# Patient Record
Sex: Male | Born: 1985 | Hispanic: No | Marital: Married | Smoking: Never smoker
Health system: Southern US, Community
[De-identification: ages and names within clinical notes are randomized; demographics above are authoritative.]

## PROBLEM LIST (undated history)

## (undated) HISTORY — PX: SPINE SURGERY: SHX786

## (undated) HISTORY — PX: APPENDECTOMY: SHX54

---

## 2014-05-10 DIAGNOSIS — K760 Fatty (change of) liver, not elsewhere classified: Secondary | ICD-10-CM | POA: Insufficient documentation

## 2017-01-04 DIAGNOSIS — D432 Neoplasm of uncertain behavior of brain, unspecified: Secondary | ICD-10-CM | POA: Insufficient documentation

## 2017-01-04 DIAGNOSIS — Z85841 Personal history of malignant neoplasm of brain: Secondary | ICD-10-CM | POA: Insufficient documentation

## 2020-09-22 ENCOUNTER — Other Ambulatory Visit: Payer: Self-pay

## 2020-09-22 ENCOUNTER — Ambulatory Visit
Admission: EM | Admit: 2020-09-22 | Discharge: 2020-09-22 | Disposition: A | Discharge status: Home / Self Care | Payer: PRIVATE HEALTH INSURANCE | Attending: Emergency Medicine | Admitting: Emergency Medicine

## 2020-09-22 DIAGNOSIS — N451 Epididymitis: Secondary | ICD-10-CM | POA: Insufficient documentation

## 2020-09-22 LAB — URINALYSIS, COMPLETE (UACMP) WITH MICROSCOPIC
Bacteria, UA: NONE SEEN
Bilirubin Urine: NEGATIVE
Glucose, UA: NEGATIVE mg/dL
Hgb urine dipstick: NEGATIVE
Ketones, ur: NEGATIVE mg/dL
Leukocytes,Ua: NEGATIVE
Nitrite: NEGATIVE
Protein, ur: NEGATIVE mg/dL
Specific Gravity, Urine: 1.02 (ref 1.005–1.030)
Squamous Epithelial / HPF: NONE SEEN (ref 0–5)
pH: 7 (ref 5.0–8.0)

## 2020-09-22 LAB — PREGNANCY, URINE: Preg Test, Ur: NEGATIVE

## 2020-09-22 MED ORDER — DOXYCYCLINE HYCLATE 100 MG PO CAPS: 100.0000 mg | ORAL_CAPSULE | Freq: Two times a day (BID) | ORAL | 0 refills | Status: AC

## 2020-09-22 MED ORDER — IBUPROFEN 600 MG PO TABS: 600.0000 mg | ORAL_TABLET | Freq: Four times a day (QID) | ORAL | 0 refills | Status: DC | PRN

## 2020-09-22 NOTE — ED Provider Notes (Signed)
[facility]    CSN: [id number] Arrival date & time: 09/22/20  1424      History   Chief Complaint Chief Complaint  Patient presents with  . Groin Pain    HPI Jack Spencer is a 35 y.o. male.   HPI   35 year old male here for evaluation of groin pain.  Patient reports that he has had pain that has been waxing and waning for the last 3 to 4 days.  He mostly has it on the left side of his scrotum but occasionally will be on the right.  Patient has a history of epididymitis in the past with his most recent infection being at Christmas time 2021.  Patient states he is also had some frequency of urination.  Patient denies fever, pain with urination, or penile discharge.  Patient is sexually active but he is in a monogamous relationship with his spouse for last 7 years.  History reviewed. No pertinent past medical history.  There are no problems to display for this patient.   Past Surgical History:  Procedure Laterality Date  . APPENDECTOMY    . SPINE SURGERY         Home Medications    Prior to Admission medications   Medication Sig Start Date End Date Taking? Authorizing Provider  doxycycline (VIBRAMYCIN) 100 MG capsule Take 1 capsule (100 mg total) by mouth 2 (two) times daily for 7 days. 09/22/20 09/29/20 Yes Becky Augusta, NP  escitalopram (LEXAPRO) 10 MG tablet Take 1 tablet by mouth daily. 08/09/17  Yes [provider]  ibuprofen (ADVIL) 600 MG tablet Take 1 tablet (600 mg total) by mouth every 6 (six) hours as needed. 09/22/20  Yes Becky Augusta, NP    Family History Family History  Problem Relation Age of Onset  . Breast cancer Mother   . Colon cancer Father     Social History Social History   Tobacco Use  . Smoking status: Never Smoker  . Smokeless tobacco: Never Used  Vaping Use  . Vaping Use: Never used  Substance Use Topics  . Alcohol use: Not Currently  . Drug use: Not Currently     Allergies   Patient has no known  allergies.   Review of Systems Review of Systems  Constitutional: Negative for activity change, appetite change and fever.  Gastrointestinal: Negative for abdominal pain, nausea and vomiting.  Genitourinary: Positive for frequency and testicular pain. Negative for dysuria, penile discharge, penile pain, penile swelling, scrotal swelling and urgency.  Musculoskeletal: Negative for back pain.  Skin: Negative for rash.     Physical Exam Triage Vital Signs ED Triage Vitals  Enc Vitals Group     BP 09/22/20 1442 (!) 127/95     Pulse Rate 09/22/20 1442 97     Resp 09/22/20 1442 17     Temp 09/22/20 1442 98.7 F (37.1 C)     Temp Source 09/22/20 1442 Oral     SpO2 09/22/20 1442 97 %     Weight 09/22/20 1439 165 lb (74.8 kg)     Height 09/22/20 1439 5\' 5"  (1.651 m)     Head Circumference --      Peak Flow --      Pain Score 09/22/20 1439 2     Pain Loc --      Pain Edu? --      Excl. in GC? --    No data found.  Updated Vital Signs BP (!) 127/95 (BP Location: Left Arm)  Pulse 97   Temp 98.7 F (37.1 C) (Oral)   Resp 17   Ht 5\' 5"  (1.651 m)   Wt 165 lb (74.8 kg)   SpO2 97%   BMI 27.46 kg/m   Visual Acuity Right Eye Distance:   Left Eye Distance:   Bilateral Distance:    Right Eye Near:   Left Eye Near:    Bilateral Near:     Physical Exam Vitals and nursing note reviewed.  Constitutional:      General: He is not in acute distress.    Appearance: Normal appearance. He is not ill-appearing.  HENT:     Head: Normocephalic and atraumatic.  Cardiovascular:     Rate and Rhythm: Normal rate and regular rhythm.     Pulses: Normal pulses.     Heart sounds: Normal heart sounds. No murmur heard. No gallop.   Pulmonary:     Effort: Pulmonary effort is normal.     Breath sounds: Normal breath sounds. No wheezing, rhonchi or rales.  Neurological:     Mental Status: He is alert.      UC Treatments / Results  Labs (all labs ordered are listed, but only abnormal  results are displayed) Labs Reviewed  URINALYSIS, COMPLETE (UACMP) WITH MICROSCOPIC  PREGNANCY, URINE    EKG   Radiology No results found.  Procedures Procedures (including critical care time)  Medications Ordered in UC Medications - No data to display  Initial Impression / Assessment and Plan / UC Course  I have reviewed the triage vital signs and the nursing notes.  Pertinent labs & imaging results that were available during my care of the patient were reviewed by me and considered in my medical decision making (see chart for details).   Patient is a pleasant 35 year old male who is here for evaluation of groin pain.  Patient is very tearful and is concerned that he may have testicular cancer.  Patient reports that his pain has been coming and going for last 3 to 4 days.  His left side is worse than his right side.  Patient has history of epididymitis and has been treated with antibiotics twice in the past.  He says the pain feels very much the same as last 2 times but he is concerned as to why he is getting epididymitis again.  Patient has had some frequency of urination but no urgency or pain with urination, no penile discharge, no fever.  Physical exam reveals an unremarkable penis without rashes, swelling, or discharge from the urethral meatus.  Patient's testicles are tender but he says this is baseline for him.  The testicles are nonswollen, they have a smooth texture, no masses appreciated.  Left epididymis is enlarged.  No bleeding palpated and no bleeding of the spermatic cord palpated.  Right epididymis is normal in size without bleeding.  Patient's exam is consistent with epididymitis.  Will check UA for urinary frequency to ensure he does not have a UTI.  Urinalysis is negative for infection.  We will discharge patient home on doxycycline twice daily for 7 days for treatment of epididymitis, have patient take ibuprofen 600 mg every 6 hours as needed for pain, and wear  supportive underwear.  We will also send referral to urology for recurrent epididymitis at patient's request.   Final Clinical Impressions(s) / UC Diagnoses   Final diagnoses:  Epididymitis, left     Discharge Instructions     Take the doxycycline twice daily for 7 days with food for  treatment of the epididymitis.  The ibuprofen, 600 mg every 6 hours with food, as needed for testicular discomfort.  Wear supportive underwear.  We have referred you to urology for evaluation of recurrent epididymitis.    ED Prescriptions    Medication Sig Dispense Auth. Provider   doxycycline (VIBRAMYCIN) 100 MG capsule Take 1 capsule (100 mg total) by mouth 2 (two) times daily for 7 days. 14 capsule Becky Augusta, NP   ibuprofen (ADVIL) 600 MG tablet Take 1 tablet (600 mg total) by mouth every 6 (six) hours as needed. 30 tablet Becky Augusta, NP     PDMP not reviewed this encounter.   Becky Augusta, NP 09/22/20 1555

## 2020-09-22 NOTE — Discharge Instructions (Addendum)
Take the doxycycline twice daily for 7 days with food for treatment of the epididymitis.  The ibuprofen, 600 mg every 6 hours with food, as needed for testicular discomfort.  Wear supportive underwear.  We have referred you to urology for evaluation of recurrent epididymitis.

## 2020-09-22 NOTE — ED Triage Notes (Signed)
Patient states that he has been having groin pain over the last few days worse on the left side. Patient states that he has had minimal swelling.

## 2020-09-24 DIAGNOSIS — F411 Generalized anxiety disorder: Secondary | ICD-10-CM | POA: Insufficient documentation

## 2020-10-01 ENCOUNTER — Other Ambulatory Visit: Payer: Self-pay

## 2020-10-01 ENCOUNTER — Other Ambulatory Visit
Admission: RE | Admit: 2020-10-01 | Discharge: 2020-10-01 | Disposition: A | Discharge status: Home / Self Care | Payer: 59 | Attending: Urology | Admitting: Urology

## 2020-10-01 ENCOUNTER — Ambulatory Visit (INDEPENDENT_AMBULATORY_CARE_PROVIDER_SITE_OTHER): Payer: 59 | Admitting: Urology

## 2020-10-01 ENCOUNTER — Other Ambulatory Visit: Payer: Self-pay | Admitting: *Deleted

## 2020-10-01 ENCOUNTER — Encounter: Payer: Self-pay | Admitting: Urology

## 2020-10-01 VITALS — BP 131/91 | HR 101 | Ht 65.0 in | Wt 169.0 lb

## 2020-10-01 DIAGNOSIS — N453 Epididymo-orchitis: Secondary | ICD-10-CM

## 2020-10-01 DIAGNOSIS — N5082 Scrotal pain: Secondary | ICD-10-CM | POA: Diagnosis not present

## 2020-10-01 LAB — URINALYSIS, COMPLETE (UACMP) WITH MICROSCOPIC
Bilirubin Urine: NEGATIVE
Glucose, UA: NEGATIVE mg/dL
Hgb urine dipstick: NEGATIVE
Ketones, ur: NEGATIVE mg/dL
Leukocytes,Ua: NEGATIVE
Nitrite: NEGATIVE
Protein, ur: NEGATIVE mg/dL
Specific Gravity, Urine: 1.02 (ref 1.005–1.030)
pH: 7 (ref 5.0–8.0)

## 2020-10-01 NOTE — Progress Notes (Signed)
   10/01/20 12:41 PM   Madelaine Bhat Lequita Halt 12-17-1985 [id number]  CC: recurrent epididymitis/scrotal pain  HPI: I saw Mr. Skeens in urology clinic today for the above issues.  Is a 35 year old male who reports 3 episodes of " epididymitis" over the last 2 to 3 years.  Urinalysis each time was completely benign with no bacteria, leukocytes, WBCs, or microscopic hematuria.  He was treated in urgent cares with antibiotics, and reports that his pain would ultimately improve over the next 5 to 10 days.  No prior positive urine cultures.  He denies any urinary symptoms or gross hematuria.  He is otherwise healthy.  He admits to a fair amount of health anxiety related to his scrotal pain.  He denies any prior urologic surgeries.  He thinks this is mostly been on the left side, but occasionally will have right-sided scrotal pain as well.  He reports his testicles are always sensitive.  He denies any history of STDs, and is married for the last 7 years with only one sexual partner.  Urinalysis today benign.   Family History: Family History  Problem Relation Age of Onset  . Breast cancer Mother   . Colon cancer Father     Social History:  reports that he has never smoked. He has never used smokeless tobacco. He reports previous alcohol use. He reports previous drug use.  Physical Exam: BP (!) 131/91   Pulse (!) 101   Ht 5\' 5"  (1.651 m)   Wt 169 lb (76.7 kg)   BMI 28.12 kg/m    Constitutional:  Alert and oriented, No acute distress. Cardiovascular: No clubbing, cyanosis, or edema. Respiratory: Normal respiratory effort, no increased work of breathing. GI: Abdomen is soft, nontender, nondistended, no abdominal masses GU: Circumcised phallus with patent meatus, no lesions, testicles 37 cc and descended bilaterally without masses, mildly tender bilaterally.  Laboratory Data: Reviewed, see HPI  Pertinent Imaging: None to review  Assessment & Plan:   In summary he is a 35 year old healthy male  with 3 episodes of scrotal pain over the last 2 to 3 years that have resolved with antibiotics, though all urinalyses at the time of presentation have been completely benign.  He is concerned this could potentially represent cancer.  We discussed the differences between chronic scrotal pain and true epididymitis, and it is difficult to determine if he truly has had infections, or he has had flares of idiopathic scrotal pain that have resolved spontaneously.  Certainly, the fact that he improved with antibiotics would suggest this could be related to epididymitis.  I recommended starting cranberry tablets daily to help prevent any infections, and I ordered a scrotal ultrasound to rule out any underlying abnormalities.  We also discussed behavioral strategies of snug fitting underwear, and NSAIDs for flares of scrotal pain  Call with scrotal ultrasound results  31, MD 10/01/2020  [facility] [address] [place], [place] [phone]

## 2020-10-03 ENCOUNTER — Telehealth: Payer: Self-pay | Admitting: *Deleted

## 2020-10-03 NOTE — Telephone Encounter (Signed)
Spoke with patient and advised results   

## 2020-10-03 NOTE — Telephone Encounter (Signed)
.  left message to have patient return my call.  

## 2020-10-03 NOTE — Telephone Encounter (Signed)
Okay to take anti-inflammatories for pain, will call with scrotal ultrasound results  Legrand Rams, MD 10/03/2020

## 2020-10-03 NOTE — Telephone Encounter (Signed)
Patient called today and states he is having more pain in his right testicle not his left now. Patient is not having an fever or chills . Pain level was at 1-2 now   He states he is getting an ultrasound tomorrow.

## 2020-10-04 ENCOUNTER — Other Ambulatory Visit: Payer: Self-pay

## 2020-10-04 ENCOUNTER — Telehealth: Payer: Self-pay

## 2020-10-04 ENCOUNTER — Ambulatory Visit
Admission: RE | Admit: 2020-10-04 | Discharge: 2020-10-04 | Disposition: A | Discharge status: Home / Self Care | Payer: PRIVATE HEALTH INSURANCE | Source: Ambulatory Visit | Attending: Urology | Admitting: Urology

## 2020-10-04 DIAGNOSIS — N5082 Scrotal pain: Secondary | ICD-10-CM | POA: Diagnosis present

## 2020-10-04 NOTE — Telephone Encounter (Signed)
Pt LM on triage line requesting results of ultrasound. Please advise.

## 2020-10-07 ENCOUNTER — Telehealth: Payer: Self-pay

## 2020-10-07 NOTE — Telephone Encounter (Signed)
Called pt informed him of the information below. Pt gave verbal understanding. Lengthy discussion had with pt in regards to normal results. Advised pt to call back if sx worsen or change. Pt voiced understanding.

## 2020-10-07 NOTE — Telephone Encounter (Signed)
-----   Message from Sondra Come, MD sent at 10/07/2020  7:45 AM EDT ----- No worrisome findings on scrotal US  Legrand Rams, MD 10/07/2020

## 2020-10-07 NOTE — Telephone Encounter (Signed)
Pt calls triage line, LM questioning his u/s report which mentions cellulitis and hydrocele. Pt questions of he should be concerned about this. Please advise.

## 2020-10-07 NOTE — Telephone Encounter (Signed)
Called pt, no answer. Left detailed message for pt, informing him of the information below per DPR. Advised pt to call back for questions or concerns.

## 2020-10-07 NOTE — Telephone Encounter (Signed)
He had no evidence at all of cellulitis on our exam the other week, and the small hydroceles are normal, just extra fluid around the testicles. No worrisome findings  .[email or web address]

## 2021-04-23 DIAGNOSIS — D239 Other benign neoplasm of skin, unspecified: Secondary | ICD-10-CM | POA: Insufficient documentation

## 2021-04-23 DIAGNOSIS — J3089 Other allergic rhinitis: Secondary | ICD-10-CM | POA: Insufficient documentation

## 2021-08-07 ENCOUNTER — Ambulatory Visit: Payer: 59 | Admitting: Physician Assistant

## 2021-08-13 ENCOUNTER — Ambulatory Visit: Payer: 59 | Admitting: Urology

## 2021-08-14 ENCOUNTER — Ambulatory Visit: Payer: Self-pay | Admitting: Urology

## 2021-10-01 ENCOUNTER — Encounter: Payer: Self-pay | Admitting: Urology

## 2021-10-01 ENCOUNTER — Other Ambulatory Visit: Payer: Self-pay

## 2021-10-01 ENCOUNTER — Ambulatory Visit (INDEPENDENT_AMBULATORY_CARE_PROVIDER_SITE_OTHER): Payer: Self-pay | Admitting: Urology

## 2021-10-01 VITALS — BP 141/88 | HR 93 | Ht 65.0 in | Wt 170.0 lb

## 2021-10-01 DIAGNOSIS — M6289 Other specified disorders of muscle: Secondary | ICD-10-CM

## 2021-10-01 DIAGNOSIS — N453 Epididymo-orchitis: Secondary | ICD-10-CM

## 2021-10-01 LAB — MICROSCOPIC EXAMINATION
Bacteria, UA: NONE SEEN
Epithelial Cells (non renal): NONE SEEN /hpf (ref 0–10)
RBC, Urine: NONE SEEN /hpf (ref 0–2)
WBC, UA: NONE SEEN /hpf (ref 0–5)

## 2021-10-01 LAB — URINALYSIS, COMPLETE
Bilirubin, UA: NEGATIVE
Glucose, UA: NEGATIVE
Ketones, UA: NEGATIVE
Leukocytes,UA: NEGATIVE
Nitrite, UA: NEGATIVE
Protein,UA: NEGATIVE
RBC, UA: NEGATIVE
Specific Gravity, UA: 1.025 (ref 1.005–1.030)
Urobilinogen, Ur: 0.2 mg/dL (ref 0.2–1.0)
pH, UA: 7 (ref 5.0–7.5)

## 2021-10-01 NOTE — Patient Instructions (Signed)
Pelvic Floor Dysfunction, Male   Pelvic floor dysfunction (PFD) is a condition that results when the group of muscles and connective tissues that support the organs in the pelvis (pelvic floor muscles) do not work well. These muscles and their connections form a sling that supports the colon and bladder. In men, these muscles also support the prostate gland. PFD causes pelvic floor muscles to be too weak, too tight, or both. In PFD, muscle movements are not coordinated. This may cause bowel or bladder problems. It may also cause pain. What are the causes? This condition may be caused by an injury to the pelvic area or by a weakening of pelvic muscles. In many cases, the exact cause is not known. What increases the risk? The following factors may make you more likely to develop PFD: Having chronic bladder tissue inflammation (interstitial cystitis). Being an older person. Being overweight. History of radiation treatment for cancer in the pelvic region. Previous pelvic surgery, such as removal of the prostate gland (prostatectomy). What are the signs or symptoms? Symptoms of this condition vary and may include: Bladder symptoms, such as: Trouble starting urination and emptying the bladder. Frequent urinary tract infections. Leaking urine when coughing, laughing, or exercising (stress incontinence). Having to pass urine urgently or frequently. Pain when passing urine. Bowel symptoms, such as: Constipation. Urgent or frequent bowel movements. Incomplete bowel movements. Painful bowel movements. Leaking stool or gas. Unexplained genital or rectal pain. Genital or rectal muscle spasms. Low back pain. Sexual dysfunction, such as erectile dysfunction, premature ejaculation, or pain during or after sexual activity. How is this diagnosed? This condition is diagnosed based on: Your symptoms and medical history. A physical exam. During the exam, your health care provider may check your pelvic  muscles for tightness, spasm, pain, or weakness. This may include a rectal exam. In some cases, you may have diagnostic tests, such as: Electrical muscle function tests. Urine flow testing. X-ray tests of bowel function. Ultrasound of the pelvic organs. How is this treated? Treatment for this condition depends on your symptoms. Treatment options include: Physical therapy. This may include Kegel exercises to help relax or strengthen the pelvic floor muscles. Biofeedback. This type of therapy provides feedback on how tight your pelvic floor muscles are so that you can learn to control them. Massage therapy. A treatment that involves electrical stimulation of the pelvic floor muscles to help control pain (transcutaneous electrical nerve stimulation, or TENS). Sound wave therapy (ultrasound) to reduce muscle spasms. Medicines, such as: Muscle relaxants. Bladder control medicines. Surgery to reconstruct or support pelvic floor muscles may be an option if other treatments do not help. Follow these instructions at home: Activity Do your usual activities as told by your health care provider. Ask your health care provider if you should modify any activities. Do pelvic floor strengthening or relaxing exercises at home as told by your physical therapist. Lifestyle Maintain a healthy weight. Eat foods that are high in fiber, such as beans, whole grains, and fresh fruits and vegetables. Limit foods that are high in fat and processed sugars, such as fried or sweet foods. Manage stress with relaxation techniques such as yoga or meditation. General instructions If you have problems with leakage: Use absorbable pads or wear padded underwear. Wash your genital and anal area frequently with mild soap. Keep your genital and anal area as clean and dry as possible. Ask your health care provider if you should try a barrier cream to prevent skin irritation. Take warm baths to relieve  pelvic muscle tension or  spasms. Take over-the-counter and prescription medicines only as told by your health care provider. Keep all follow-up visits. How is this prevented? The cause of PFD is not always known, but there are a few things you can do to reduce the risk of developing this condition, including: Staying at a healthy weight. Getting regular exercise. Managing stress. Contact a health care provider if: Your symptoms are not improving with home care. You have signs or symptoms of PFD that get worse. You develop new signs or symptoms. You have signs of a urinary tract infection, such as: Fever. Chills. Increased urinary frequency. A burning feeling when urinating. You have not had a bowel movement in 3 days (constipation). Summary Pelvic floor dysfunction results when the muscles and connective tissues in your pelvic floor do not work well. These muscles and their connections form a sling that supports your colon and bladder. In men, these muscles also support the prostate gland. PFD may be caused by an injury to the pelvic area or by a weakening of pelvic muscles. PFD causes pelvic floor muscles to be too weak, too tight, or a combination of both. Symptoms may vary from person to person. In most cases, PFD can be treated with physical therapies and medicines. Surgery may be an option if other treatments do not help. This information is not intended to replace advice given to you by your health care provider. Make sure you discuss any questions you have with your health care provider. Document Revised: 11/20/2020 Document Reviewed: 11/20/2020 Elsevier Patient Education  [place].  Pelvic Pain, Male Pelvic pain is pain in your lower abdomen, below your belly button and between your hips. The pain may start suddenly (be acute), keep coming back (recur), or last a long time (become chronic). Pelvic pain that lasts longer than six months is considered chronic. There are many possible causes of  pelvic pain. Sometimes, the cause is not known. Pelvic pain may affect your: Prostate gland. Urinary system. Digestive tract. Musculoskeletal system. Strained muscles or ligaments may cause pelvic pain. Follow these instructions at home: Medicines Take over-the-counter and prescription medicines only as told by your health care provider. If you were prescribed an antibiotic medicine, take it as told by your health care provider. Do not stop taking the antibiotic even if you start to feel better. Managing pain, stiffness, and swelling  Take warm water baths (sitz baths). Sitz baths help with relaxing your pelvic floor muscles. For a sitz bath, the water only comes up to your hips and covers your buttocks. A sitz bath may done at home in a bathtub or with a portable sitz bath that fits over the toilet. If directed, apply heat to the affected area before you exercise. Use the heat source that your health care provider recommends, such as a moist heat pack or a heating pad. Place a towel between your skin and the heat source. Leave the heat on for 20-30 minutes. Remove the heat if your skin turns bright red. This is especially important if you are unable to feel pain, heat, or cold. You may have a greater risk of getting burned. General instructions Rest as told by your health care provider. Keep a journal of your pelvic pain. Write down: When the pain started. Where the pain is located. What seems to make the pain better or worse. Any symptoms you have along with the pain. Follow your treatment plan as told by your health care provider. This  may include: Pelvic physical therapy. Yoga, meditation, and exercise. Biofeedback. This process trains you to manage your body's response (physiological response) through breathing techniques and relaxation methods. You will work with a therapist while machines are used to monitor your physical symptoms. Acupuncture. This is a type of treatment that  involves stimulating specific points on your body by inserting thin needles through your skin to treat pain. Keep all follow-up visits as told by your health care provider. This is important. Contact a health care provider if: Medicine does not help your pain. Your pain comes back. You have new symptoms. You have a fever or chills. You are constipated. You have blood in your urine or stool. You feel weak or light-headed. Get help right away if: You have sudden severe pain. Your pain steadily gets worse. You have severe pain along with fever, nausea, vomiting, or excessive sweating. Summary Pelvic pain is pain in your lower abdomen, below your belly button and between your hips. There are many possible causes of pelvic pain. Sometimes, the cause is not known. Take over-the-counter and prescription medicines only as told by your health care provider. If you were prescribed an antibiotic medicine, take it as told by your health care provider. Do not stop taking the antibiotic even if you start to feel better. Contact a health care provider if you have new or worsening symptoms. Get help right away if you have severe pain along with fever, nausea, vomiting, or excessive sweating. Keep all follow-up visits as told by your health care provider. This is important. This information is not intended to replace advice given to you by your health care provider. Make sure you discuss any questions you have with your health care provider. Document Revised: 12/01/2017 Document Reviewed: 12/01/2017 Elsevier Patient Education  [place].

## 2021-10-01 NOTE — Progress Notes (Signed)
? ?  10/01/2021 ?3:26 PM  ? ?Jack Spencer ?November 01, 1985 ?[id number] ? ?Reason for visit: Follow up scrotal pain ? ?HPI: ?36 year old male with significant anxiety and health related anxiety who I am seeing for follow-up of scrotal pain.  I last saw him in March 2022 after he had some reported episodes of " epididymitis," however urinalysis and cultures were benign at those times.  He had a scrotal ultrasound last year that was benign.  He does think his anxiety has increased during the pandemic, and he recently started seeing a therapist again which she feels has been helpful.  He drinks primarily carbonated water and tea during the day. ? ?He has had a few other flares of some groin and left scrotal pain, as well as some occasional urinary frequency and pelvic pressure after voiding.  He feels like these are exacerbated when he has anxiety about other issues. ? ?On exam today, testicles are 20 cc and descended bilaterally without masses, nontender. ? ?His clinical picture and history are more consistent with pelvic floor dysfunction, and we had an extensive conversation today about pelvic floor dysfunction and pelvic pain, and that working on anxiety, avoiding bladder irritants, and pelvic floor physical therapy of the mainstays of treatment.  We also discussed stone fitting underwear, NSAIDs, and sitz bath's. ? ?-Referral placed to pelvic floor physical therapy for pelvic floor dysfunction ?-RTC 6 months virtual visit ? ?I spent 30 total minutes on the day of the encounter including pre-visit review of the medical record, face-to-face time with the patient, and post visit ordering of labs/imaging/tests. ? ? ?Sondra Come, MD ? ?[facility] ?[address] ?[place], [place] [address] ?[phone] ? ?

## 2021-10-30 ENCOUNTER — Ambulatory Visit: Payer: Self-pay | Attending: Urology | Admitting: Physical Therapy

## 2021-10-30 ENCOUNTER — Encounter: Payer: Self-pay | Admitting: Physical Therapy

## 2021-10-30 DIAGNOSIS — M62838 Other muscle spasm: Secondary | ICD-10-CM

## 2021-10-30 DIAGNOSIS — M6289 Other specified disorders of muscle: Secondary | ICD-10-CM | POA: Insufficient documentation

## 2021-10-30 DIAGNOSIS — R102 Pelvic and perineal pain unspecified side: Secondary | ICD-10-CM

## 2021-10-30 DIAGNOSIS — R278 Other lack of coordination: Secondary | ICD-10-CM

## 2021-10-30 NOTE — Therapy (Signed)
?OUTPATIENT PHYSICAL THERAPY MALE PELVIC EVALUATION ? ? ?Patient Name: Jack Spencer ?MRN: [id number] ?DOB:05/28/1986, 36 y.o., male ?Today's Date: 10/31/2021 ? ? PT End of Session - 10/30/21 1030   ? ? Visit Number 1   ? Number of Visits 12   ? Date for PT Re-Evaluation 01/22/22   ? Authorization Type IE 10/30/2021   ? PT Start Time 1030   ? PT Stop Time 1115   ? PT Time Calculation (min) 45 min   ? Activity Tolerance Patient tolerated treatment well   ? Behavior [facility] for tasks assessed/performed   ? ?  ?  ? ?  ? ? ?History reviewed. No pertinent past medical history. ?Past Surgical History:  ?Procedure Laterality Date  ? APPENDECTOMY    ? SPINE SURGERY    ? ?There are no problems to display for this patient. ? ? ?PCP: Cathlean Marseillesaaleman, Timothy P, MD ? ?REFERRING PROVIDER: Sondra ComeSninsky, Brian C, MD ? ?REFERRING DIAG: M62.89 (ICD-10-CM) - Pelvic floor dysfunction  ? ?THERAPY DIAG:  ?Pelvic pain - Plan: PT plan of care cert/re-cert ? ?Other muscle spasm - Plan: PT plan of care cert/re-cert ? ?Other lack of coordination - Plan: PT plan of care cert/re-cert ? ?ONSET DATE: 2022 ? ?SUBJECTIVE ? ?Chief Complaint: Patient states that over the last year he started to notice pain that comes and goes. Patient seems to only notice it when he is dealing with increased anxious feelings. Patient qualifies the pain as annoying more than disruptive. Patient keenly aware of habit of catastrophizing. Patient states that urinary symptoms include post-void UI (a drop or two), urinary urge/frequency. Patient states that pain will happen in twitches and lightning flash of pain. This may happen 4-5x/day for a week or two; patient states that most pain is ~2/10 on average.  ? ?Pertinent History:  ?"36 year old male with significant anxiety and health related anxiety who I am seeing for follow-up of scrotal pain.  I last saw him in March 2022 after he had some reported episodes of " epididymitis," however urinalysis and cultures were benign at  those times.  He had a scrotal ultrasound last year that was benign.  He does think his anxiety has increased during the pandemic, and he recently started seeing a therapist again which she feels has been helpful.  He drinks primarily carbonated water and tea during the day. ?  ?He has had a few other flares of some groin and left scrotal pain, as well as some occasional urinary frequency and pelvic pressure after voiding.  He feels like these are exacerbated when he has anxiety about other issues. ?  ?On exam today, testicles are 20 cc and descended bilaterally without masses, nontender. ?  ?His clinical picture and history are more consistent with pelvic floor dysfunction, and we had an extensive conversation today about pelvic floor dysfunction and pelvic pain, and that working on anxiety, avoiding bladder irritants, and pelvic floor physical therapy of the mainstays of treatment.  We also discussed stone fitting underwear, NSAIDs, and sitz bath's." ? ?Falls Negative. ?Surgical history: Positive for see above. ? ?Work History:  ? ?Occupation: desk job ? ? ?Repeated movements: patient sits with R leg tucked under L in figure 4 for extended periods of time.  ? ?Urological History: ?Unremarkable work-up to date.  ? ?Urinary History: ?Fluid Intake: no still H20, 3 cans of sparkling water, unsweetened ice tea, rare/occasional soda ?Nocturia: 0-1x/night ?Frequency of urination: every 2 hours ?Pain with urination: Negative  ?Difficulty initiating urination:  Negative  ?Intermittent stream: Negative  ?Post-micturition dribble: Positive  ?Frequent UTI: Negative. ? ?Gastrointestinal History: ?Patient notes some constipation or diarrhea in more anxious states or when traveling. Otherwise is regular, 1x/day.  ? ?Location of pain: scrotum, lower abdomen, lower back ?Current pain: 0.5-1/10 ?Max pain: 5-6/10 duration 1 sec, no predictable pattern. Patient will have pain with ejaculation when pain is flared up.  ?Least pain:  0/10 ?Pain quality: pain quality: lightning flash, zap  ?Radiating pain: No ?([place]antes Criteria for pudendal neuralgia: ?Pain is expressed in the anatomical territory of the pudendal nerve. Yes ?Pain is predominantly experienced while sitting because of compression. No The pain does not wake the patient at night. No ?No objective sensory impairment. No ) ? ?Patient Goals: "so the flare ups are milder and I have a way to respond to them" "practicing good habits on a daily basis that don't exacerbate it" ? ? ? ?OBJECTIVE ? ?Mental Status ?Patient is oriented to person, place and time. ?Recent memory is intact. ?Remote memory is intact. ?Attention span and concentration are intact. ?Expressive speech is intact. ?Patient's fund of knowledge is within normal limits for educational level. ? ?POSTURE/OBSERVATIONS:  ?Lumbar lordosis: diminished ?Thoracic kyphosis: mildly increased ?Iliac crest height: appearing equal bilaterally  ?Lumbar lateral shift: not formally assessed   ?Pelvic obliquity: not formally assessed  ?Leg length discrepancy: not formally assessed ? ? ?GAIT: ?Grossly WFL. Patient ambulates with limited arm swing and limited pelvic rotation, but no gross aberrations noted.  ? ?SENSATION: deferred 2/2 to time constraints  ?Grossly intact to light touch bilateral LEs as determined by testing dermatomes L2-S2 Proprioception and hot/cold testing deferred on this date ? ?RANGE OF MOTION: deferred 2/2 to time constraints  ?  LEFT RIGHT  ?Lumbar forward flexion (65):      ?Lumbar extension (30):     ?Lumbar lateral flexion (25):     ?Thoracic and Lumbar rotation (30 degrees):       ?Hip Flexion (0-125):      ?Hip IR (0-45):     ?Hip ER (0-45):     ?Hip Abduction (0-40):     ?Hip extension (0-15):     ? ? ?STRENGTH: MMT deferred 2/2 to time constraints  ? RLE LLE  ?Hip Flexion    ?Hip Extension    ?Hip Abduction     ?Hip Adduction     ?Hip ER     ?Hip IR     ?Knee Extension    ?Knee Flexion    ?Dorsiflexion      ?Plantarflexion (seated)    ? ?ABDOMINAL: deferred 2/2 to time constraints  ?Palpation: ?Suprapubic percussion:  ?Diastasis: ?Scar mobility:  ?Rib flare: ? ?SPECIAL TESTS:deferred 2/2 to time constraints  ? ? ?PHYSICAL PERFORMANCE MEASURES:  ?STS: WNL ?Deep Squat: ?RLE STS: ?LLE STS:  ? :  ?5TSTS: ? ?EXTERNAL PELVIC EXAM: deferred 2/2 to time constraints  ?Palpation: ?Breath coordination: ?Perineal Mobility Testing: ?Voluntary Contraction: present/absent ?Relaxation: present/delayed/ non-relaxing ?Perineal movement with sustained increase in IAP (?bear down?):  ?Perineal movement with rapid increase in IAP (?cough?): ? ?DIGITAL RECTAL EXAM: deferred 2/2 to time constraints  ?Strength (PERF): ?Symmetry: ?Palpation: ?Prolapse: ? ?EDUCATION ?Patient educated on prognosis, POC, and provided with HEP including: not initiated. Patient articulated understanding and returned demonstration. Patient will benefit from further education in order to maximize compliance and understanding for long-term therapeutic gains. ? ?OUTCOME MEASURES: NIH CPSI Pain 6, Urinary 6, QoL 4; FOTO PFDI Pain 4, Urinary 8 ? ?ASSESSMENT ?Patient is a 36  year old presenting to clinic with chief complaints of pelvic pain. Today's evaluation is suggestive of deficits in PFM coordination, PFM non-neurologic tone, and posture as evidenced by shortened anterior chain with increased thoracic kyphosis and diminished lumbar lordosis, 6/10 worst pain, post-micturition dribble, occurrence of weak urinary stream. Patient's responses on NIH-CPSI outcome measures (see above) indicate moderate functional limitations/disability/distress. Patient's progress may be limited due to comorbidities and the relationship between stress and pelvic pain; however, patient's motivation is advantageous. Patient will benefit from continued skilled therapeutic intervention to address deficits in PFM coordination, PFM non-neurologic tone, and posture in order to increase  function and improve overall QOL. ? ?OBJECTIVE IMPAIRMENTS decreased activity tolerance, decreased coordination, increased muscle spasms, impaired flexibility, improper body mechanics, postural dysfunction, and pain.  ? ?

## 2022-04-07 ENCOUNTER — Ambulatory Visit (INDEPENDENT_AMBULATORY_CARE_PROVIDER_SITE_OTHER): Payer: Self-pay | Admitting: Urology

## 2022-04-07 DIAGNOSIS — M6289 Other specified disorders of muscle: Secondary | ICD-10-CM

## 2022-04-07 NOTE — Progress Notes (Signed)
Virtual Visit via Telephone Note  I connected with Jack Spencer on 04/07/22 at 11:30 AM EDT by telephone and verified that I am speaking with the correct person using two identifiers.   Patient location: Home Provider location: [facility]   I discussed the limitations, risks, security and privacy concerns of performing an evaluation and management service by telephone and the availability of in person appointments. We discussed the impact of the COVID-19 pandemic on the healthcare system, and the importance of social distancing and reducing patient and provider exposure. I also discussed with the patient that there may be a patient responsible charge related to this service. The patient expressed understanding and agreed to proceed.  Reason for visit: Scrotal pain  History of Present Illness: 36 year old male who I last saw in March 2023 for scrotal pain/" epididymitis" with negative work-up.  Ultimately was felt this was most likely secondary to health-related anxiety and stress.  He also saw pelvic floor physical therapy.  He denies any problems over the last few months and has been doing well.  He really denies any urinary complaints.  Reassurance was provided and return precautions discussed.   Follow Up: Follow with urology as needed   I discussed the assessment and treatment plan with the patient. The patient was provided an opportunity to ask questions and all were answered. The patient agreed with the plan and demonstrated an understanding of the instructions.   The patient was advised to call back or seek an in-person evaluation if the symptoms worsen or if the condition fails to improve as anticipated.  I provided 6 minutes of non-face-to-face time during this encounter.   Sondra Come, MD

## 2022-04-20 DIAGNOSIS — G4739 Other sleep apnea: Secondary | ICD-10-CM | POA: Insufficient documentation

## 2022-04-20 DIAGNOSIS — K582 Mixed irritable bowel syndrome: Secondary | ICD-10-CM | POA: Insufficient documentation

## 2022-06-07 ENCOUNTER — Emergency Department: Payer: PRIVATE HEALTH INSURANCE

## 2022-06-07 ENCOUNTER — Emergency Department: Payer: Managed Care, Other (non HMO)

## 2022-06-07 ENCOUNTER — Emergency Department
Admission: EM | Admit: 2022-06-07 | Discharge: 2022-06-07 | Disposition: A | Discharge status: Home / Self Care | Payer: Managed Care, Other (non HMO) | Attending: Emergency Medicine | Admitting: Emergency Medicine

## 2022-06-07 ENCOUNTER — Emergency Department
Admission: EM | Admit: 2022-06-07 | Discharge: 2022-06-07 | Disposition: A | Discharge status: Home / Self Care | Payer: PRIVATE HEALTH INSURANCE | Attending: Emergency Medicine | Admitting: Emergency Medicine

## 2022-06-07 ENCOUNTER — Other Ambulatory Visit: Payer: Self-pay

## 2022-06-07 DIAGNOSIS — D72829 Elevated white blood cell count, unspecified: Secondary | ICD-10-CM | POA: Diagnosis not present

## 2022-06-07 DIAGNOSIS — R109 Unspecified abdominal pain: Secondary | ICD-10-CM | POA: Diagnosis present

## 2022-06-07 DIAGNOSIS — R110 Nausea: Secondary | ICD-10-CM | POA: Diagnosis not present

## 2022-06-07 DIAGNOSIS — R7401 Elevation of levels of liver transaminase levels: Secondary | ICD-10-CM | POA: Diagnosis not present

## 2022-06-07 LAB — URINALYSIS, ROUTINE W REFLEX MICROSCOPIC
Bacteria, UA: NONE SEEN
Bacteria, UA: NONE SEEN
Bilirubin Urine: NEGATIVE
Bilirubin Urine: NEGATIVE
Glucose, UA: NEGATIVE mg/dL
Glucose, UA: NEGATIVE mg/dL
Hgb urine dipstick: NEGATIVE
Hgb urine dipstick: NEGATIVE
Ketones, ur: 5 mg/dL — AB
Ketones, ur: 5 mg/dL — AB
Leukocytes,Ua: NEGATIVE
Leukocytes,Ua: NEGATIVE
Nitrite: NEGATIVE
Nitrite: NEGATIVE
Protein, ur: 30 mg/dL — AB
Protein, ur: 30 mg/dL — AB
Specific Gravity, Urine: 1.024 (ref 1.005–1.030)
Specific Gravity, Urine: 1.02 (ref 1.005–1.030)
Squamous Epithelial / HPF: NONE SEEN (ref 0–5)
pH: 7 (ref 5.0–8.0)
pH: 5 (ref 5.0–8.0)

## 2022-06-07 LAB — HEPATIC FUNCTION PANEL
ALT: 54 U/L — ABNORMAL HIGH (ref 0–44)
AST: 27 U/L (ref 15–41)
Albumin: 4.5 g/dL (ref 3.5–5.0)
Alkaline Phosphatase: 102 U/L (ref 38–126)
Bilirubin, Direct: 0.3 mg/dL — ABNORMAL HIGH (ref 0.0–0.2)
Indirect Bilirubin: 0.9 mg/dL (ref 0.3–0.9)
Total Bilirubin: 1.2 mg/dL (ref 0.3–1.2)
Total Protein: 8.7 g/dL — ABNORMAL HIGH (ref 6.5–8.1)

## 2022-06-07 LAB — CBC
HCT: 47.9 % (ref 39.0–52.0)
HCT: 47 % (ref 39.0–52.0)
Hemoglobin: 16.3 g/dL (ref 13.0–17.0)
Hemoglobin: 16 g/dL (ref 13.0–17.0)
MCH: 29.2 pg (ref 26.0–34.0)
MCH: 29.3 pg (ref 26.0–34.0)
MCHC: 34 g/dL (ref 30.0–36.0)
MCHC: 34 g/dL (ref 30.0–36.0)
MCV: 85.7 fL (ref 80.0–100.0)
MCV: 86.1 fL (ref 80.0–100.0)
Platelets: 293 10*3/uL (ref 150–400)
Platelets: 287 K/uL (ref 150–400)
RBC: 5.59 MIL/uL (ref 4.22–5.81)
RBC: 5.46 MIL/uL (ref 4.22–5.81)
RDW: 11.9 % (ref 11.5–15.5)
RDW: 11.9 % (ref 11.5–15.5)
WBC: 14.6 10*3/uL — ABNORMAL HIGH (ref 4.0–10.5)
WBC: 13.9 K/uL — ABNORMAL HIGH (ref 4.0–10.5)
nRBC: 0 % (ref 0.0–0.2)
nRBC: 0 % (ref 0.0–0.2)

## 2022-06-07 LAB — BASIC METABOLIC PANEL WITH GFR
Anion gap: 9 (ref 5–15)
BUN: 11 mg/dL (ref 6–20)
CO2: 24 mmol/L (ref 22–32)
Calcium: 9.5 mg/dL (ref 8.9–10.3)
Chloride: 106 mmol/L (ref 98–111)
Creatinine, Ser: 0.9 mg/dL (ref 0.61–1.24)
GFR, Estimated: >60 mL/min (ref >60)
Glucose, Bld: 123 mg/dL — ABNORMAL HIGH (ref 70–99)
Potassium: 4 mmol/L (ref 3.5–5.1)
Sodium: 139 mmol/L (ref 135–145)

## 2022-06-07 LAB — BASIC METABOLIC PANEL
Anion gap: 11 (ref 5–15)
BUN: 12 mg/dL (ref 6–20)
CO2: 23 mmol/L (ref 22–32)
Calcium: 9.3 mg/dL (ref 8.9–10.3)
Chloride: 104 mmol/L (ref 98–111)
Creatinine, Ser: 0.85 mg/dL (ref 0.61–1.24)
GFR, Estimated: >60 mL/min (ref >60)
Glucose, Bld: 113 mg/dL — ABNORMAL HIGH (ref 70–99)
Potassium: 3.8 mmol/L (ref 3.5–5.1)
Sodium: 138 mmol/L (ref 135–145)

## 2022-06-07 LAB — LIPASE, BLOOD: Lipase: 45 U/L (ref 11–51)

## 2022-06-07 MED ORDER — OXYCODONE-ACETAMINOPHEN 5-325 MG PO TABS
1.0000 | ORAL_TABLET | Freq: Once | ORAL | Status: AC
  Administered 2022-06-07: 1 via ORAL
  Filled 2022-06-07: qty 1

## 2022-06-07 MED ORDER — LIDOCAINE 5 % EX PTCH: 1.0000 | MEDICATED_PATCH | Freq: Two times a day (BID) | CUTANEOUS | 0 refills | Status: DC

## 2022-06-07 MED ORDER — ONDANSETRON 4 MG PO TBDP: 4.0000 mg | ORAL_TABLET | Freq: Three times a day (TID) | ORAL | 0 refills | Status: DC | PRN

## 2022-06-07 MED ORDER — KETOROLAC TROMETHAMINE 30 MG/ML IJ SOLN
30.0000 mg | Freq: Once | INTRAMUSCULAR | Status: AC
  Administered 2022-06-07: 30 mg via INTRAVENOUS
  Filled 2022-06-07: qty 1

## 2022-06-07 MED ORDER — LIDOCAINE 5 % EX PTCH
1.0000 | MEDICATED_PATCH | CUTANEOUS | Status: DC
  Administered 2022-06-07: 1 via TRANSDERMAL
  Filled 2022-06-07: qty 1

## 2022-06-07 MED ORDER — ONDANSETRON HCL 4 MG/2ML IJ SOLN
4.0000 mg | Freq: Once | INTRAMUSCULAR | Status: AC
  Administered 2022-06-07: 4 mg via INTRAVENOUS
  Filled 2022-06-07: qty 2

## 2022-06-07 MED ORDER — KETOROLAC TROMETHAMINE 10 MG PO TABS
ORAL_TABLET | ORAL | 0 refills | Status: DC
Start: 2022-06-07 — End: 2022-12-08

## 2022-06-07 MED ORDER — CYCLOBENZAPRINE HCL 10 MG PO TABS: 10.0000 mg | ORAL_TABLET | Freq: Three times a day (TID) | ORAL | 0 refills | Status: AC | PRN

## 2022-06-07 MED ORDER — CYCLOBENZAPRINE HCL 10 MG PO TABS
10.0000 mg | ORAL_TABLET | Freq: Once | ORAL | Status: AC
  Administered 2022-06-07: 10 mg via ORAL
  Filled 2022-06-07: qty 1

## 2022-06-07 MED ORDER — OXYCODONE-ACETAMINOPHEN 5-325 MG PO TABS: 1.0000 | ORAL_TABLET | Freq: Three times a day (TID) | ORAL | 0 refills | Status: AC | PRN

## 2022-06-07 NOTE — ED Triage Notes (Signed)
Pt sts that he was seen today and d/c. Pt sts that the toradol worked that he received in the ED but the pain is very bad. Pt is very tearful in triage.

## 2022-06-07 NOTE — ED Provider Notes (Signed)
[facility] Provider Note    Event Date/Time   First MD Initiated Contact with Patient 06/07/22 0900     (approximate)   History   No chief complaint on file.   HPI  Jack Spencer is a 36 y.o. male presents to the ED with complaint of right flank pain that began during the night.  Patient states that it radiates around to the front and he has had nausea but denies any vomiting.  No history of injury to the flank area.  Patient believes that he had a kidney stone but was not actually seen for it.  Positive family history of stones.  Patient has a history of neurolysed anxiety, IBS with constipation and diarrhea, sleep apnea, hepatic steatosis and past surgical history appendectomy.     Physical Exam   Triage Vital Signs: ED Triage Vitals [06/07/22 0835]  Enc Vitals Group     BP (!) 144/102     Pulse Rate (!) 101     Resp 20     Temp 98.5 F (36.9 C)     Temp Source Oral     SpO2 95 %     Weight 170 lb (77.1 kg)     Height 5\' 5"  (1.651 m)     Head Circumference      Peak Flow      Pain Score 9     Pain Loc      Pain Edu?      Excl. in GC?     Most recent vital signs: Vitals:   06/07/22 0835 06/07/22 1052  BP: (!) 144/102 (!) 126/92  Pulse: (!) 101 84  Resp: 20 16  Temp: 98.5 F (36.9 C)   SpO2: 95% 93%     General: Awake, no distress.  Tearful CV:  Good peripheral perfusion.  Heart regular rate and rhythm. Resp:  Normal effort.  Lungs are clear bilaterally. Abd:  No distention.  Soft, nontender, bowel sounds are present x4 quadrants. Other:  Right flank area tender to light palpation.  Range of motion is guarded secondary to discomfort.  Muscle skeletal tenderness on palpation.  No point tenderness on palpation of the lumbar spine.  Patient guards with movement and has an antalgic gait.   ED Results / Procedures / Treatments   Labs (all labs ordered are listed, but only abnormal results are displayed) Labs Reviewed  URINALYSIS,  ROUTINE W REFLEX MICROSCOPIC - Abnormal; Notable for the following components:      Result Value   Color, Urine YELLOW (*)    APPearance CLEAR (*)    Ketones, ur 5 (*)    Protein, ur 30 (*)    All other components within normal limits  BASIC METABOLIC PANEL - Abnormal; Notable for the following components:   Glucose, Bld 123 (*)    All other components within normal limits  CBC - Abnormal; Notable for the following components:   WBC 14.6 (*)    All other components within normal limits      RADIOLOGY  CT scan renal study per radiologist is negative for urolithiasis or hydronephrosis. Mild hepatic steatosis.   PROCEDURES:  Critical Care performed:   Procedures   MEDICATIONS ORDERED IN ED: Medications  lidocaine (LIDODERM) 5 % 1 patch (1 patch Transdermal Patch Applied 06/07/22 1050)  ketorolac (TORADOL) 30 MG/ML injection 30 mg (30 mg Intravenous Given 06/07/22 0951)  ondansetron (ZOFRAN) injection 4 mg (4 mg Intravenous Given 06/07/22 0951)     IMPRESSION /  MDM / ASSESSMENT AND PLAN / ED COURSE  I reviewed the triage vital signs and the nursing notes.   Differential diagnosis includes, but is not limited to, right flank pain, muscle skeletal pain, muscle strain, urinary tract infection.  36 year old male presents to the ED with complaint of right flank pain that occurred during the night with nausea but no vomiting.  Patient believes that he has in the past had a kidney stone but did not seek medical attention at that time.  Patient urinalysis was negative for RBCs and bacteria.  Ketones were 5 and protein 30 otherwise urinalysis was clear.  BMP nonfasting showed a glucose of 123 and WBC 14.6.  Patient was extremely tearful during his visit initially.  He was given Toradol 30 mg IV and Zofran 4 mg IV and approximately 35 minutes he had relief of his pain from a 10 down to "80% less pain".  A Lidoderm patch was applied.  I spoke to him about the possibility of this being  muscle skeletal pain rather than a kidney stone.  Patient got emotional stating he has anxiety about his health.  Father was present and agrees with the plan and patient was noted to walk to the restroom unassisted without any difficulty and increased gait.  His vital signs improved and patient was made aware that this was a good sign that his pain was under better control then he realized.  A prescription for Toradol, Lidoderm and Zofran was sent to the pharmacy.  Increase fluids.  He is to follow-up with his PCP if any continued problems or return to the emergency department if any severe worsening of his symptoms.      Patient's presentation is most consistent with acute complicated illness / injury requiring diagnostic workup.  FINAL CLINICAL IMPRESSION(S) / ED DIAGNOSES   Final diagnoses:  Acute right flank pain     Rx / DC Orders   ED Discharge Orders          Ordered    ondansetron (ZOFRAN-ODT) 4 MG disintegrating tablet  Every 8 hours PRN        06/07/22 1125    lidocaine (LIDODERM) 5 %  Every 12 hours        06/07/22 1125    ketorolac (TORADOL) 10 MG tablet        06/07/22 1125             Note:  This document was prepared using Dragon voice recognition software and may include unintentional dictation errors.   Johnn Hai, PA-C 06/07/22 1448    Blake Divine, MD 06/07/22 [phone]

## 2022-06-07 NOTE — Discharge Instructions (Signed)
Follow with your primary care provider if any continued problems or concerns.  Continue with the Toradol 10 mg every 6 hours with food, Lidoderm patch every 12 hours and Zofran 4 mg as needed for nausea or vomiting.  You may also use ice or heat to your back as needed for discomfort.  Increase fluids.  You may take Tylenol in addition to these medications if additional pain medication is needed.

## 2022-06-07 NOTE — ED Triage Notes (Signed)
Pt complaining of pain to upper right back states it started yesterday. Pain is starting to radiate to front. Pt feeling nauseated denies emesis. Pt states pain is causing some SOB. No known injury to the area. Pt states "I think its a Kidney Stone."

## 2022-06-07 NOTE — Discharge Instructions (Addendum)
-  We are unsure exactly what the source of your pain is, however given the scans in the laboratory tests that we have performed, it is very unlikely to be from any serious or life-threatening cause.  -You may take the Percocet and the cyclobenzaprine as needed for discomfort, however be careful as it may make you dizzy/drowsy.  Do not operate any machinery while you are taking these medications.  -Please follow-up with your primary care provider within the next few days for reevaluation.  -Return to the emergency department anytime if you begin to experience any new or worsening symptoms.

## 2022-06-07 NOTE — ED Notes (Signed)
Pt. To rad. 

## 2022-06-07 NOTE — ED Provider Notes (Signed)
[facility] Provider Note    Event Date/Time   First MD Initiated Contact with Patient 06/07/22 2037     (approximate)   History   Chief Complaint Flank Pain   HPI Jack Spencer is a 36 y.o. male, history of fatty liver, presents to the emergency department for evaluation of right-sided flank pain.  He states that he was here earlier for the same symptoms, for which he underwent a CT scan and lab work.  His work-up was overall negative, but he did respond well to the ketorolac and states that he was pain-free.  However, he states that his symptoms recur "in waves" periodically throughout the day.  He states that when it does occur, it is 10/10 pain.  He does mention abnormally elevated liver enzymes a month ago and is concerned to gallstones.  Denies fever/chills, chest pain, shortness of breath, nausea/vomiting, dysuria, weakness, rash/lesions, dizziness/lightheadedness, or headache.  History Limitations: No limitations.        Physical Exam  Triage Vital Signs: ED Triage Vitals  Enc Vitals Group     BP 06/07/22 2010 (!) 149/107     Pulse Rate 06/07/22 2010 100     Resp 06/07/22 2010 (!) 21     Temp 06/07/22 2010 98.9 F (37.2 C)     Temp Source 06/07/22 2010 Oral     SpO2 06/07/22 2010 98 %     Weight 06/07/22 2011 165 lb (74.8 kg)     Height --      Head Circumference --      Peak Flow --      Pain Score 06/07/22 2011 9     Pain Loc --      Pain Edu? --      Excl. in GC? --     Most recent vital signs: Vitals:   06/07/22 2010  BP: (!) 149/107  Pulse: 100  Resp: (!) 21  Temp: 98.9 F (37.2 C)  SpO2: 98%    General: Awake, appears very distraught and tearful. Skin: Warm, dry. No rashes or lesions.  Eyes: PERRL. Conjunctivae normal.  CV: Good peripheral perfusion.  Resp: Normal effort.  Abd: Soft, non-tender. No distention.  Neuro: At baseline. No gross neurological deficits.  Musculoskeletal: Normal ROM of all  extremities.  Focused Exam: Negative CVAT bilaterally.  No abdominal tenderness.  No distention.  Patient states he is currently asymptomatic, but he continues to be very tearful.  Physical Exam    ED Results / Procedures / Treatments  Labs (all labs ordered are listed, but only abnormal results are displayed) Labs Reviewed  URINALYSIS, ROUTINE W REFLEX MICROSCOPIC - Abnormal; Notable for the following components:      Result Value   Color, Urine YELLOW (*)    APPearance CLEAR (*)    Ketones, ur 5 (*)    Protein, ur 30 (*)    All other components within normal limits  BASIC METABOLIC PANEL - Abnormal; Notable for the following components:   Glucose, Bld 113 (*)    All other components within normal limits  CBC - Abnormal; Notable for the following components:   WBC 13.9 (*)    All other components within normal limits  HEPATIC FUNCTION PANEL - Abnormal; Notable for the following components:   Total Protein 8.7 (*)    ALT 54 (*)    Bilirubin, Direct 0.3 (*)    All other components within normal limits  LIPASE, BLOOD     EKG N/A.  RADIOLOGY  ED Provider Interpretation: I personally reviewed and interpreted this ultrasound, no evidence of gallstones.  US Abdomen Limited RUQ (LIVER/GB)  Result Date: 06/07/2022 CLINICAL DATA:  Right upper quadrant pain EXAM: ULTRASOUND ABDOMEN LIMITED RIGHT UPPER QUADRANT COMPARISON:  CT from the same day FINDINGS: Gallbladder: No gallstones or wall thickening visualized. No sonographic Murphy sign noted by sonographer. Common bile duct: Diameter: 3.2 mm, unremarkable. Liver: No focal lesion identified. Echogenic parenchyma. Portal vein is patent on color Doppler imaging with normal direction of blood flow towards the liver. Other: None. IMPRESSION: 1. Negative. No gallstones. 2. Echogenic hepatic parenchyma suggesting steatosis. Electronically Signed   By: Corlis Leak M.D.   On: 06/07/2022 21:44   CT Renal Stone Study  Result Date:  06/07/2022 CLINICAL DATA:  Right flank pain EXAM: CT ABDOMEN AND PELVIS WITHOUT CONTRAST TECHNIQUE: Multidetector CT imaging of the abdomen and pelvis was performed following the standard protocol without IV contrast. RADIATION DOSE REDUCTION: This exam was performed according to the departmental dose-optimization program which includes automated exposure control, adjustment of the mA and/or kV according to patient size and/or use of iterative reconstruction technique. COMPARISON:  None Available. FINDINGS: Lower chest: No acute abnormality. Hepatobiliary: Mild hepatic steatosis. No focal liver abnormality is seen. No gallstones, gallbladder wall thickening, or biliary dilatation. Pancreas: Unremarkable. No pancreatic ductal dilatation or surrounding inflammatory changes. Spleen: Normal in size without focal abnormality. Adrenals/Urinary Tract: Adrenal glands are unremarkable. Kidneys are normal, without renal calculi, focal lesion, or hydronephrosis. Bladder is unremarkable. Stomach/Bowel: Stomach is within normal limits. No evidence of bowel wall thickening, distention, or inflammatory changes. Vascular/Lymphatic: Minimal calcified atherosclerosis of the right common iliac artery. No significant vascular findings are present. No enlarged abdominal or pelvic lymph nodes. Reproductive: Nonspecific mineralization within the right prostate gland, likely physiologic. Punctate hyperdensity in the right pelvis (series 2, image 78) is favored to represent a phlebolith. Other: No abdominal wall hernia or abnormality. No abdominopelvic ascites. Musculoskeletal: No acute or significant osseous findings. IMPRESSION: No etiology for right flank pain identified. Specifically, no nephrolithiasis or hydronephrosis. Electronically Signed   By: Jacob Moores M.D.   On: 06/07/2022 09:34    PROCEDURES:  Critical Care performed: N/A.  Procedures    MEDICATIONS ORDERED IN ED: Medications  oxyCODONE-acetaminophen  (PERCOCET/ROXICET) 5-325 MG per tablet 1 tablet (has no administration in time range)  cyclobenzaprine (FLEXERIL) tablet 10 mg (has no administration in time range)  oxyCODONE-acetaminophen (PERCOCET/ROXICET) 5-325 MG per tablet 1 tablet (1 tablet Oral Given 06/07/22 2101)     IMPRESSION / MDM / ASSESSMENT AND PLAN / ED COURSE  I reviewed the triage vital signs and the nursing notes.                              Differential diagnosis includes, but is not limited to, cystitis, pyelonephritis, cholelithiasis, choledocholithiasis, cholecystitis, pancreatitis  ED Course Patient appears well, but very anxious.  Not endorsing any pain at this time.  CBC shows mild leukocytosis at 13.9, decreased from 14.6 earlier today.  No anemia.  BMP shows no electrolyte abnormalities or AKI.  Urinalysis shows no evidence of urinary tract infection or hematuria.  Hepatic function panel shows slightly elevated ALT, but otherwise no significant transaminitis or hyperbilirubinemia.  Assessment/Plan Patient presents with right-sided flank pain x1 day.  He is extremely distraught and tearful.  His physical exam does not reveal any true findings.  His previous CT renal stone study does  not show any acute findings either.  His lab work-up does show a slight elevation in WBC, however I attribute this likely due to pain/anxiety.  His lab work-up is overall unremarkable.  Urinalysis shows no evidence of infection.  There is no evidence to suggest gallstones or cholecystitis on his ultrasound.  No transaminitis on his hepatic function panel.  I spoke to him about these findings.  He expressed frustration and continued to be very tearful about not knowing what is going on.  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  I suspect that this is likely the source of most of his pain/discomfort.  Advised him that there could be something driving his pain, however is unlikely  to be serious or life-threatening given the lack of radiographic or laboratory findings.  We will provide him with a brief course of Percocet and cyclobenzaprine to help manage his discomfort, however I did request that he be reevaluated by his regular doctor within the next few days.  He was agreeable to this.  Will discharge.  Considered admission for this patient, but given his stable presentation and unremarkable findings, he is unlikely to benefit from admission.  Provided the patient with anticipatory guidance, return precautions, and educational material. Encouraged the patient to return to the emergency department at any time if they begin to experience any new or worsening symptoms. Patient expressed understanding and agreed with the plan.   Patient's presentation is most consistent with acute complicated illness / injury requiring diagnostic workup.       FINAL CLINICAL IMPRESSION(S) / ED DIAGNOSES   Final diagnoses:  Flank pain     Rx / DC Orders   ED Discharge Orders          Ordered    oxyCODONE-acetaminophen (PERCOCET) 5-325 MG tablet  Every 8 hours PRN        06/07/22 2210    cyclobenzaprine (FLEXERIL) 10 MG tablet  3 times daily PRN        06/07/22 2210             Note:  This document was prepared using Dragon voice recognition software and may include unintentional dictation errors.   Varney Daily, [place] 06/07/22 2213    Shaune Pollack, MD 06/07/22 2241

## 2022-07-04 DIAGNOSIS — J302 Other seasonal allergic rhinitis: Secondary | ICD-10-CM | POA: Insufficient documentation

## 2022-07-18 IMAGING — US US SCROTUM W/ DOPPLER COMPLETE
1 series · 13 of 25 positions shown · non-contrast
Comparison: None.

CLINICAL DATA: Left-sided scrotal pain

EXAM:
SCROTAL ULTRASOUND
DOPPLER ULTRASOUND OF THE TESTICLES
TECHNIQUE: Complete ultrasound examination of the testicles, epididymis, and
other scrotal structures was performed. Color and spectral Doppler
ultrasound were also utilized to evaluate blood flow to the
testicles.

[Series 1: us scrotum w/ doppler complete · 0.07mm/px · 13 of 49 slices shown]
[im 1/49]
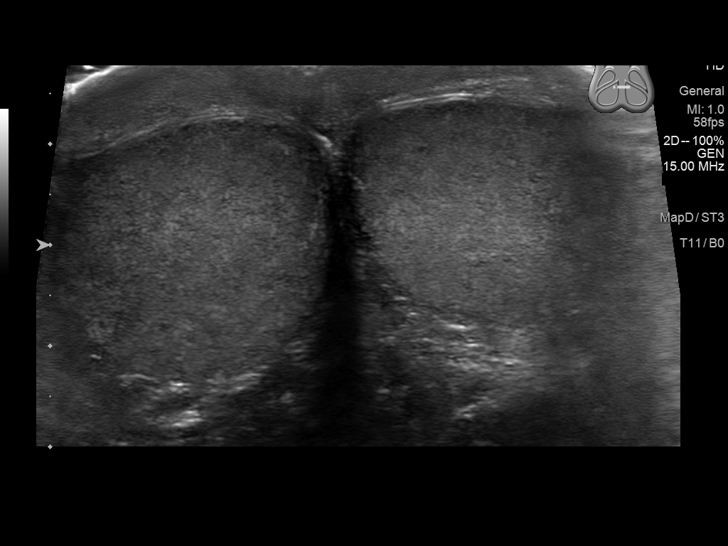
[im 5/49]
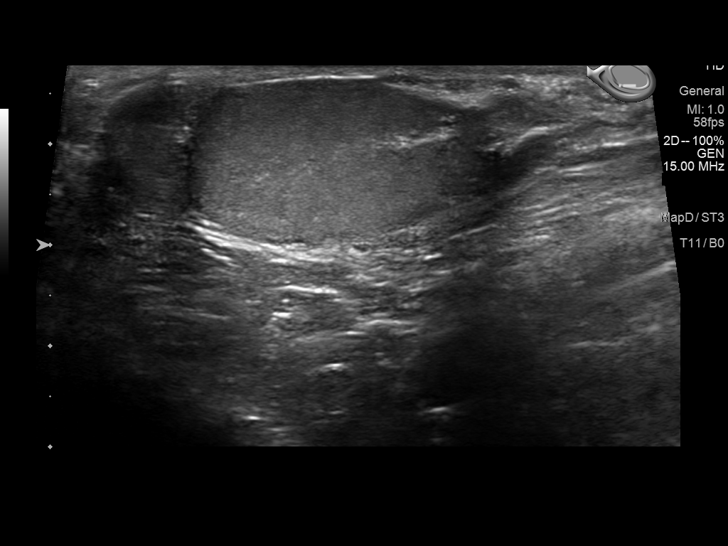
[im 9/49]
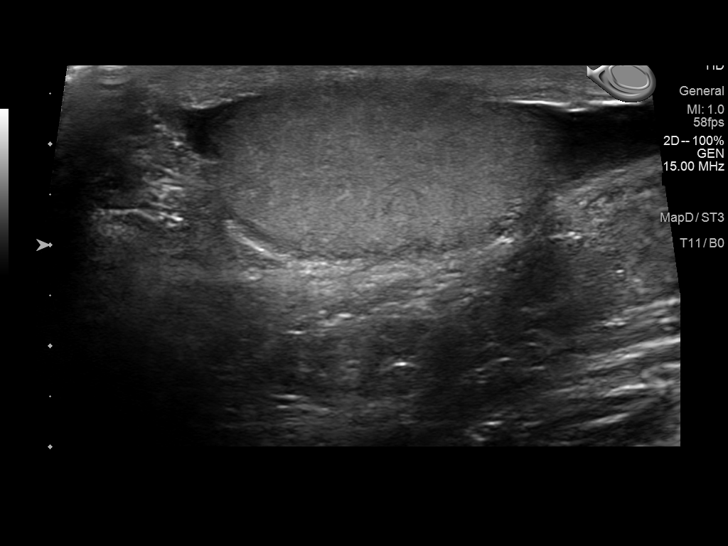
[im 13/49]
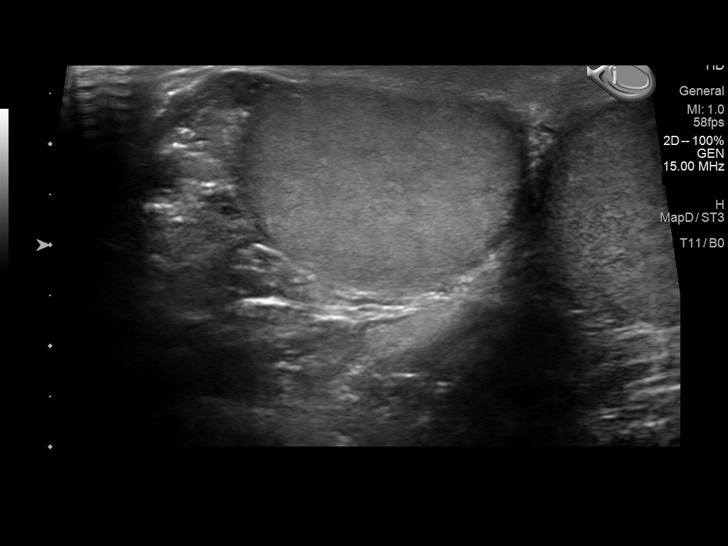
[im 17/49]
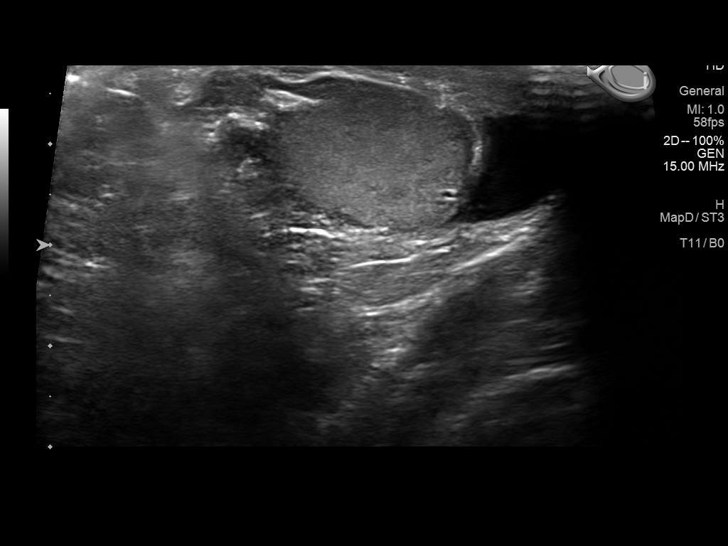
[im 21/49]
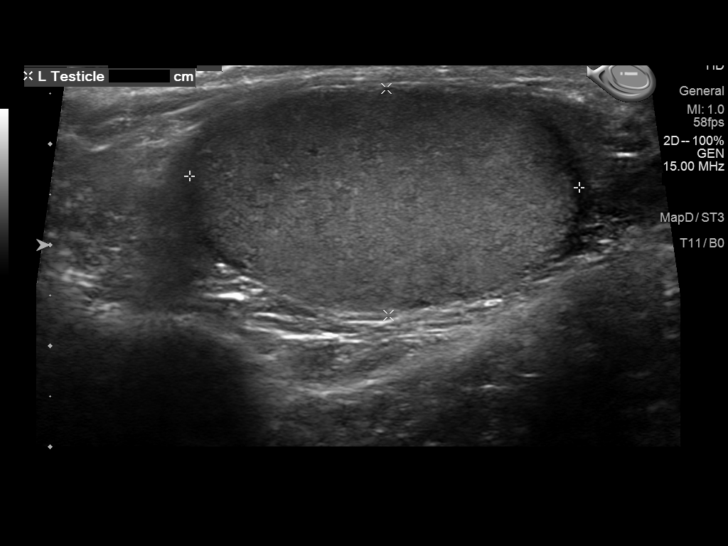
[im 25/49]
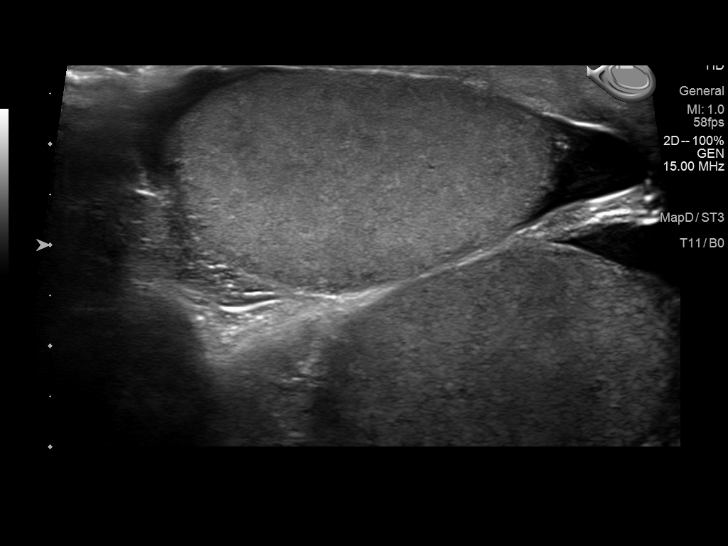
[im 29/49]
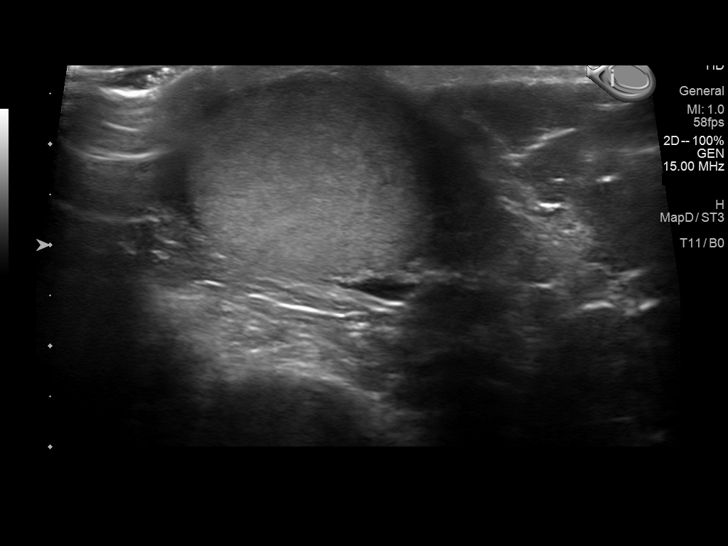
[im 33/49]
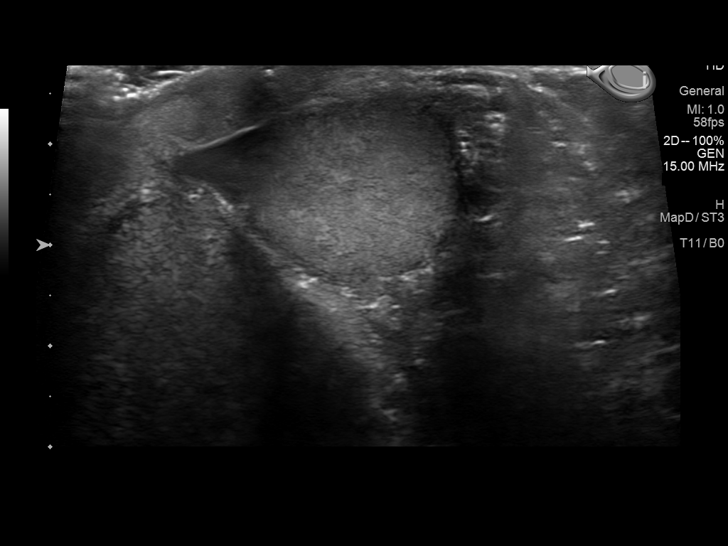
[im 37/49]
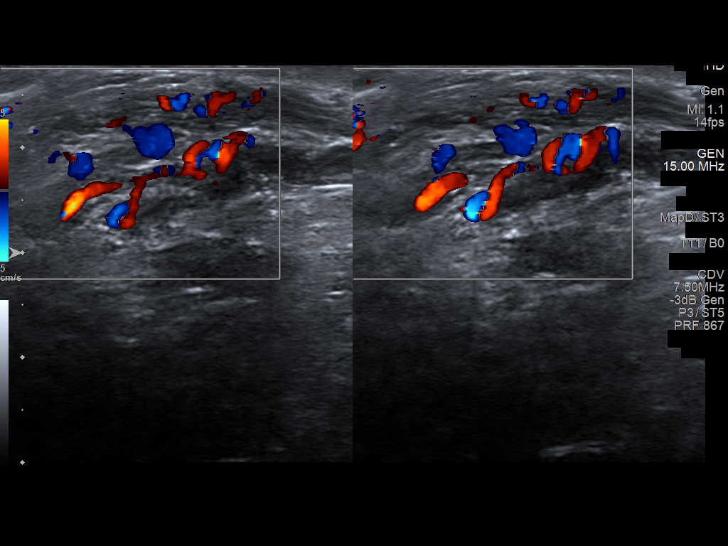
[im 41/49]
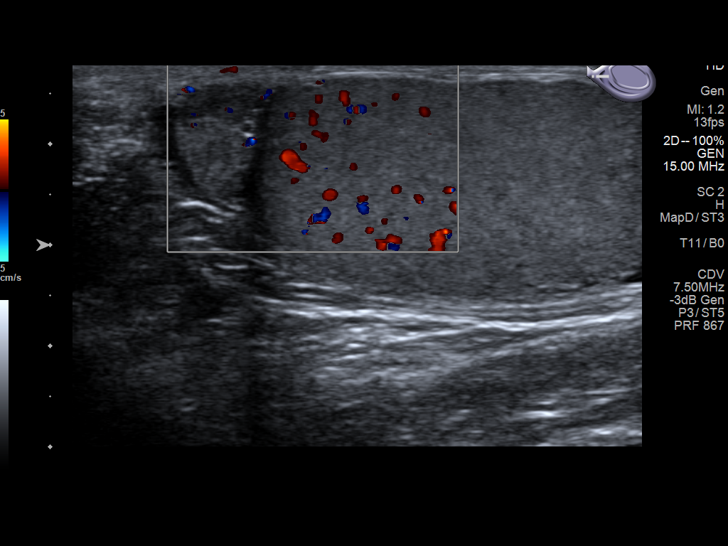
[im 45/49]
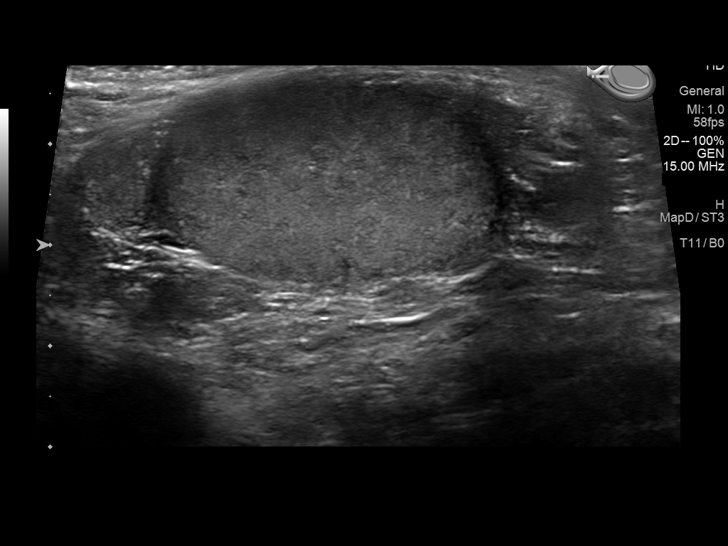
[im 49/49]
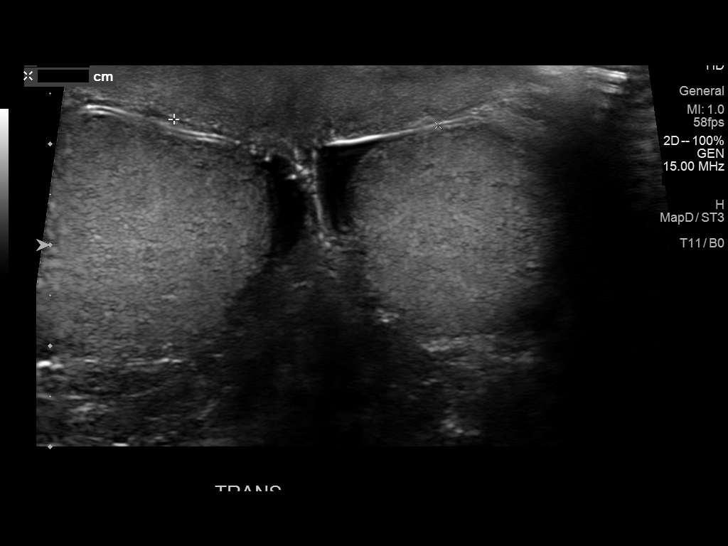

[13 of 25 positions shown; findings below may reference images not displayed]

FINDINGS: Right testicle

Measurements: 4.3 x 2.1 x 3.2 cm. No mass or microlithiasis
visualized.

Left testicle

Measurements: 3.9 x 2.2 x 2.9 cm. No mass or microlithiasis
visualized.

Right epididymis: Normal in size and appearance. No edema or
hyperemia.

Left epididymis: Normal in size and appearance. No edema or
hyperemia.

Hydrocele:  Trace hydrocele on each side.

Varicocele:  None visualized.

Pulsed Doppler interrogation of both testes demonstrates normal low
resistance arterial and venous waveforms bilaterally.

There is apparent scrotal wall thickening on each side. No evidence
scrotal abscess on either side.
IMPRESSION: 1. Scrotal wall thickening raises concern for a degree of
cellulitis.

2.  Trace hydrocele on each side.

3. No intratesticular or extratesticular mass on either side. No
orchitis or epididymitis on either side. No evident testicular
torsion on either side.

## 2022-10-07 DIAGNOSIS — M545 Low back pain, unspecified: Secondary | ICD-10-CM | POA: Insufficient documentation

## 2022-12-08 ENCOUNTER — Encounter: Payer: Self-pay | Admitting: Urology

## 2022-12-08 ENCOUNTER — Ambulatory Visit: Payer: 59 | Admitting: Urology

## 2022-12-08 VITALS — BP 138/98 | HR 84 | Ht 65.0 in | Wt 176.2 lb

## 2022-12-08 DIAGNOSIS — M6289 Other specified disorders of muscle: Secondary | ICD-10-CM | POA: Diagnosis not present

## 2022-12-08 NOTE — Patient Instructions (Signed)
Pelvic Floor Dysfunction, Male     Pelvic floor dysfunction (PFD) is a condition that results when the group of muscles and connective tissues that support the organs in the pelvis (pelvic floor muscles) do not work well. These muscles and their connections form a sling that supports the colon and bladder. In men, these muscles also support the prostate gland. PFD causes pelvic floor muscles to be too weak, too tight, or both. In PFD, muscle movements are not coordinated. This may cause bowel or bladder problems. It may also cause pain. What are the causes? This condition may be caused by an injury to the pelvic area or by a weakening of pelvic muscles. In many cases, the exact cause is not known. What increases the risk? The following factors may make you more likely to develop PFD: Having chronic bladder tissue inflammation (interstitial cystitis). Being an older person. Being overweight. History of radiation treatment for cancer in the pelvic region. Previous pelvic surgery, such as removal of the prostate gland (prostatectomy). What are the signs or symptoms? Symptoms of this condition vary and may include: Bladder symptoms, such as: Trouble starting urination and emptying the bladder. Frequent urinary tract infections. Leaking urine when coughing, laughing, or exercising (stress incontinence). Having to pass urine urgently or frequently. Pain when passing urine. Bowel symptoms, such as: Constipation. Urgent or frequent bowel movements. Incomplete bowel movements. Painful bowel movements. Leaking stool or gas. Unexplained genital or rectal pain. Genital or rectal muscle spasms. Low back pain. Sexual dysfunction, such as erectile dysfunction, premature ejaculation, or pain during or after sexual activity. How is this diagnosed? This condition is diagnosed based on: Your symptoms and medical history. A physical exam. During the exam, your health care provider may check your  pelvic muscles for tightness, spasm, pain, or weakness. This may include a rectal exam. In some cases, you may have diagnostic tests, such as: Electrical muscle function tests. Urine flow testing. X-ray tests of bowel function. Ultrasound of the pelvic organs. How is this treated? Treatment for this condition depends on your symptoms. Treatment options include: Physical therapy. This may include Kegel exercises to help relax or strengthen the pelvic floor muscles. Biofeedback. This type of therapy provides feedback on how tight your pelvic floor muscles are so that you can learn to control them. Massage therapy. A treatment that involves electrical stimulation of the pelvic floor muscles to help control pain (transcutaneous electrical nerve stimulation, or TENS). Sound wave therapy (ultrasound) to reduce muscle spasms. Medicines, such as: Muscle relaxants. Bladder control medicines. Surgery to reconstruct or support pelvic floor muscles may be an option if other treatments do not help. Follow these instructions at home: Activity Do your usual activities as told by your health care provider. Ask your health care provider if you should modify any activities. Do pelvic floor strengthening or relaxing exercises at home as told by your physical therapist. Lifestyle Maintain a healthy weight. Eat foods that are high in fiber, such as beans, whole grains, and fresh fruits and vegetables. Limit foods that are high in fat and processed sugars, such as fried or sweet foods. Manage stress with relaxation techniques such as yoga or meditation. General instructions If you have problems with leakage: Use absorbable pads or wear padded underwear. Wash your genital and anal area frequently with mild soap. Keep your genital and anal area as clean and dry as possible. Ask your health care provider if you should try a barrier cream to prevent skin irritation. Take warm baths  to relieve pelvic muscle  tension or spasms. Take over-the-counter and prescription medicines only as told by your health care provider. Keep all follow-up visits. How is this prevented? The cause of PFD is not always known, but there are a few things you can do to reduce the risk of developing this condition, including: Staying at a healthy weight. Getting regular exercise. Managing stress. Contact a health care provider if: Your symptoms are not improving with home care. You have signs or symptoms of PFD that get worse. You develop new signs or symptoms. You have signs of a urinary tract infection, such as: Fever. Chills. Increased urinary frequency. A burning feeling when urinating. You have not had a bowel movement in 3 days (constipation). Summary Pelvic floor dysfunction results when the muscles and connective tissues in your pelvic floor do not work well. These muscles and their connections form a sling that supports your colon and bladder. In men, these muscles also support the prostate gland. PFD may be caused by an injury to the pelvic area or by a weakening of pelvic muscles. PFD causes pelvic floor muscles to be too weak, too tight, or a combination of both. Symptoms may vary from person to person. In most cases, PFD can be treated with physical therapies and medicines. Surgery may be an option if other treatments do not help. This information is not intended to replace advice given to you by your health care provider. Make sure you discuss any questions you have with your health care provider. Document Revised: 11/20/2020 Document Reviewed: 11/20/2020 Elsevier Patient Education  2023 [employer] Inc[employer]    Managing Chronic Back Pain Chronic back pain is pain that lasts longer than 3 months. It often affects the lower back. It may feel like a muscle ache or a sharp, stabbing pain. It can be mild, moderate, or severe. There are things you can do to help manage your pain. See what works best for you. Your  health care provider may also give you other instructions. What actions can I take to manage my chronic back pain? You may be given a treatment plan by your provider. Treatment often starts with rest and pain relief. It may also include: Physical therapy. These are exercises to help restore movement and strength to your back. Techniques to help you relax. Counseling or therapy. Cognitive behavioral therapy (CBT) is a form of therapy that helps you set goals and make changes. Acupuncture or massage therapy. Local electrical stimulation. Injections. You may be given medicines to numb an area or relieve pain. If other treatments do not help, you may need surgery. How to use body mechanics and posture to help with pain You can help relieve stress on your back with good posture and healthy body mechanics. Body mechanics are all the ways your body moves during the day. Posture is part of body mechanics. Good posture means: Your spine is in its correct S-curve, or neutral, position. Your shoulders are pulled back a bit. Your head is not tipped forward. To improve your posture and body mechanics, follow these guidelines. Standing  When standing, keep your feet about hip-width apart. Keep your knees slightly bent. Your ears, shoulders, and hips should line up. Your spine should be neutral. When you stand in one place for a long time, place one foot on a stable object that is 2-4 inches (5-10 cm) high, such as a footstool. Sitting  When sitting, keep your feet flat on the floor. Use a footrest, if needed. Keep  your thighs parallel to the floor. Try not to round your shoulders or tilt your head forward. When working at a desk or a computer: Position your desk so your hands are a little lower than your elbows. Slide your chair under your desk so you are close enough to have good posture. Position your monitor so you are looking straight ahead and do not have to tilt your head to view the  screen. Lifting  Keep your feet shoulder-width apart. Tighten the muscles of your abdomen. Bend your knees and hips. Keep your spine neutral. Lift using the strength of your legs, not your back. Do not lock your knees straight out. Ask for help to lift heavy or awkward objects. Resting  Do not lie down in a way that causes pain. If you have pain when you sit, bend, stoop, or squat, lie in a way that your body does not bend much. Try not to curl up on your side with your arms and knees near your chest (fetal position). If it hurts to stand for a long time or reach with your arms, lie with your spine neutral and knees bent slightly. Try lying: On your side with a pillow between your knees. On your back with a pillow under your knees. How to recognize changes in your chronic back pain Let your provider know if your pain gets worse or does not get better with treatment. Your back pain may be getting worse if you have pain that: Starts to cause problems with your posture. Gets worse when you sit, stand, walk, bend, or lift things. Happens when you are active, at rest, or both. Makes it hard for you to move around (limits mobility). Occurs with fever, weight loss, or trouble peeing (urinating). Causes numbness and tingling. Follow these instructions at home: Medicines You may need to take medicines for pain and inflammation. These may be taken by mouth or put on the skin. You may also be given muscle relaxants. Take over-the-counter and prescription medicines only as told by your provider. Ask your provider if the medicine prescribed to you: Requires you to avoid driving or using machinery. Can cause constipation. You may need to take these actions to prevent or treat constipation: Drink enough fluid to keep your pee (urine) pale yellow. Take over-the-counter or prescription medicines. Eat foods that are high in fiber, such as beans, whole grains, and fresh fruits and vegetables. Limit foods  that are high in fat and processed sugars, such as fried or sweet foods. Lifestyle Do not use any products that contain nicotine or tobacco. These products include cigarettes, chewing tobacco, and vaping devices, such as e-cigarettes. If you need help quitting, ask your provider. Eat a healthy diet. Eat lots of vegetables, fruits, fish, and lean meats. Work with your provider to stay at a healthy weight. General instructions Get regular exercise as told. Exercise can help with flexibility and strength. If physical therapy was prescribed, do exercises as told by your provider. Use ice or heat therapy as told by your provider. Where can I get support? Think about joining a support group for people with chronic back pain. You can find some groups at: Pain Connection Program: painconnection.org The American Chronic Pain Association: acpanow.com Contact a health care provider if: Your pain does not get better with rest or medicine. You have new pain. You have a fever. You lose weight quickly. You have trouble doing your normal activities. You feel weak or numb in one or both of your  legs or feet. Get help right away if: You are not able to control when you pee or poop. You have severe back pain and: Nausea or vomiting. Pain in your chest or abdomen. Shortness of breath. You faint. These symptoms may be an emergency. Get help right away. Call 911. Do not wait to see if the symptoms will go away. Do not drive yourself to the hospital. This information is not intended to replace advice given to you by your health care provider. Make sure you discuss any questions you have with your health care provider. Document Revised: 03/02/2022 Document Reviewed: 03/02/2022 Elsevier Patient Education  2023 [employer].

## 2022-12-08 NOTE — Progress Notes (Signed)
   12/08/2022 3:45 PM   Jack Spencer 08/10/1985 [id number]  Reason for visit: Possible penile lesion, pelvic/groin discomfort  HPI: 37 year old male who I have seen previously for scrotal pain felt to be secondary to pelvic floor dysfunction, he has a long history of health-related anxiety and stress.  He previously improved with pelvic floor physical therapy.  He recently saw his PCP for some swelling at the tip of the penis as well as some left-sided groin pain.  They felt his exam was benign and referred him for further evaluation.  He denies any dysuria or gross hematuria.  He also feels his urine may have a greenish tent.  On exam, circumcised phallus with patent meatus, no worrisome lesions, testicles 20 cc and descended bilaterally.  Extensive prior imaging including normal scrotal ultrasound in March 2022, normal CT abdomen pelvis in November 2023, and normal abdominal ultrasound in November 2023.  Reassurance was provided regarding normal physical exam today.  I recommended he consider a 1 week course of NSAIDs for flare of his pelvic floor dysfunction, also recommended reestablishing with pelvic floor physical therapy, pelvic floor exercises provided, follow-up with urology as needed  Sondra Come, MD  [facility] [address] [place], [place] [address] [phone]

## 2023-04-20 DIAGNOSIS — K602 Anal fissure, unspecified: Secondary | ICD-10-CM | POA: Insufficient documentation

## 2023-06-07 DIAGNOSIS — N6001 Solitary cyst of right breast: Secondary | ICD-10-CM | POA: Diagnosis not present

## 2023-06-07 DIAGNOSIS — K219 Gastro-esophageal reflux disease without esophagitis: Secondary | ICD-10-CM | POA: Insufficient documentation

## 2023-06-07 DIAGNOSIS — F411 Generalized anxiety disorder: Secondary | ICD-10-CM | POA: Diagnosis not present

## 2023-07-07 DIAGNOSIS — N6001 Solitary cyst of right breast: Secondary | ICD-10-CM | POA: Diagnosis not present

## 2023-07-07 DIAGNOSIS — N6315 Unspecified lump in the right breast, overlapping quadrants: Secondary | ICD-10-CM | POA: Diagnosis not present

## 2023-09-30 NOTE — Progress Notes (Signed)
 10/04/2023 12:20 PM   Jack Spencer 1985-09-15 [id number]  Referring provider: Cathlean Marseilles, MD [id number] [facility] [address] [place],  [place] [address]  Urological history: 1. Pelvic floor dysfunction -exams benign -scrotal US (09/2020) - trace hydroceles -CT renal stone study (05/2022) - no worrisome findings   Chief Complaint  Patient presents with   Penis Pain   HPI: Jack Spencer is a 38 y.o. male who presents today for penile pain.   Previous records reviewed.   2 weeks ago he had a sudden onset of the left tip of his penis being very sensitive, he states that it was painful to wear underwear or even have the sheets touch it.  He described the pain as a stinging sensation.  He does not have any pain with erections or erectile curvature.  He also did not have any pain with ejaculation.  He states the pain is 75% better today.  Patient denies any modifying or aggravating factors.  Patient denies any recent UTI's, gross hematuria, dysuria or suprapubic/flank pain.  Patient denies any fevers, chills, nausea or vomiting.    UA yellow clear, specific gravity greater than 1.030, pH 5.5, 0-5 squamous epithelial cells, 0-5 WBCs, 11-20 RBCs, few bacteria, mucus present trauma budding yeast present and calcium oxalate crystals present.    PMH: No past medical history on file.  Surgical History: Past Surgical History:  Procedure Laterality Date   APPENDECTOMY     SPINE SURGERY      Home Medications:  Allergies as of 10/04/2023   No Known Allergies      Medication List        Accurate as of October 04, 2023 12:20 PM. If you have any questions, ask your nurse or doctor.          atorvastatin 20 MG tablet Commonly known as: LIPITOR Take 20 mg by mouth daily.   escitalopram 10 MG tablet Commonly known as: LEXAPRO Take 1 tablet by mouth daily.   famotidine 20 MG tablet Commonly known as: PEPCID Take 20 mg by mouth 2 (two) times daily.    loratadine 10 MG tablet Commonly known as: CLARITIN Take 10 mg by mouth daily.        Allergies: No Known Allergies  Family History: Family History  Problem Relation Age of Onset   Breast cancer Mother    Colon cancer Father     Social History:  reports that he has never smoked. He has never been exposed to tobacco smoke. He has never used smokeless tobacco. He reports that he does not currently use alcohol. He reports that he does not currently use drugs.  ROS: Pertinent ROS in HPI  Physical Exam: BP 123/81 (BP Location: Left Arm, Patient Position: Sitting, Cuff Size: Normal)   Pulse 66   Ht 5\' 5"  (1.651 m)   Wt 175 lb (79.4 kg)   SpO2 99%   BMI 29.12 kg/m   Constitutional:  Well nourished. Alert and oriented, No acute distress. HEENT: [place] AT, moist mucus membranes.  Trachea midline Cardiovascular: No clubbing, cyanosis, or edema. Respiratory: Normal respiratory effort, no increased work of breathing. GU: No CVA tenderness.  No bladder fullness or masses.  Patient with circumcised phallus.  Urethral meatus is patent.  No penile discharge. No penile lesions or rashes. Scrotum without lesions, cysts, rashes and/or edema.   Patient stated the penile exam was exquisitely tender. Neurologic: Grossly intact, no focal deficits, moving all 4 extremities. Psychiatric: Normal  mood and affect.  Laboratory Data: Hepatic Function Panel Order: [id number] Component Ref Range & Units 5 mo ago  Albumin 3.4 - 5.0 g/dL 4.7  Total Protein 5.7 - 8.2 g/dL 7.9  Total Bilirubin 0.3 - 1.2 mg/dL 0.6  Bilirubin, Direct 0.00 - 0.30 mg/dL 0.2  AST <=96 U/L 39 High   ALT 10 - 49 U/L 85 High   Alkaline Phosphatase 46 - 116 U/L 110  Resulting Agency [facility]   Specimen Collected: 04/20/23 11:51   Performed by: [facility] Last Resulted: 04/20/23 15:04  Received From: [facility]  Result Received: 09/23/23 09:49   Hemoglobin  A1c Order: [id number] Component Ref Range & Units 5 mo ago  Hemoglobin A1C 4.8 - 5.6 % 5.2  Estimated Average Glucose mg/dL 440  Resulting Agency [facility]  Narrative Performed by [facility] Screening or Diagnosis of Diabetes Mellitus* A1c Reference Interval        Interpretation 4.8 - 5.6                     Normal 5.7 - 6.4                     Dysglycemia >6.4                          Diabetes Mellitus  *Not recommended for diagnosis of diabetes in children with Cystic Fibrosis or with symptoms suggestive of acute onset type 1 diabetes.   A1c Glycemic Goal: <7.0 %  **Goals should be individualized; more or less stringent A1c glycemic goals may be appropriate for individual patients. (Adopted from: 2020 ADA Standards of Medical Care In Diabetes)  Specimen Collected: 04/20/23 11:51   Performed by: [facility] Last Resulted: 04/20/23 15:01  Received From: [facility]  Result Received: 09/23/23 09:49   Lipid Panel Order: [id number] Component Ref Range & Units 5 mo ago Resulting Agency  Triglycerides 0 - 150 mg/dL 440 High  [facility]  Cholesterol <=200 mg/dL 347 High  [facility]  HDL 40 - 60 mg/dL 38 Low  [facility]  LDL Calculated 40 - 99 mg/dL 425 High  [facility]  Comment: NHLBI Recommended Ranges, LDL Cholesterol, for Adults (20+yrs) (ATPIII), mg/dL Optimal              <956 Near Optimal        100-129 Borderline High     130-159 High                160-189 Very High            >=190 NHLBI Recommended Ranges, LDL Cholesterol, for Children (2-19 yrs), mg/dL Desirable            <387 Borderline High     110-129 High                 >=130  VLDL Cholesterol Cal 10 - 50 mg/dL 56.4 [facility]  Chol/HDL Ratio 1.0 - 4.5 7.7 High  [facility]   Non-HDL Cholesterol 70 - 130 mg/dL 332 High  [facility]  Comment: Non-HDL Cholesterol Recommended Ranges (mg/dL) Optimal       <951 Near Optimal 130 - 159 Borderline High 160 - 189 High  190 - 219 Very High       >220  [facility]   Specimen Collected: 04/20/23 11:51   Performed by: [facility] Last Resulted: 04/20/23 15:05  Received From: [facility]  Result Received: 09/23/23 09:49  I have reviewed the labs.   Pertinent Imaging: N/A  Assessment & Plan:    1. Penile pain/pelvic floor dysfunction -exam benign -UA with micro heme -Urine culture and atypical cultures pending  2.  Microscopic hematuria -Micro heme may represent possible UTI and/or kidney stone -He stated his pain was improving, so it is possible maybe he passed a stone prior to the pain starting and this is just the sequela -Urine cultures are pending and if positive will treat with culture appropriate antibiotics, if urine cultures are negative will proceed with CT scan  Return for Pending urine cutlure results .  These notes generated with voice recognition software. I apologize for typographical errors.  Cloretta Ned  [facility] [address]  [address] [place], [place] [address] [phone]

## 2023-10-04 ENCOUNTER — Other Ambulatory Visit
Admission: RE | Admit: 2023-10-04 | Discharge: 2023-10-04 | Disposition: A | Discharge status: Home / Self Care | Attending: Urology | Admitting: Urology

## 2023-10-04 ENCOUNTER — Encounter: Payer: Self-pay | Admitting: Urology

## 2023-10-04 ENCOUNTER — Ambulatory Visit: Payer: PRIVATE HEALTH INSURANCE | Admitting: Urology

## 2023-10-04 ENCOUNTER — Other Ambulatory Visit: Payer: Self-pay

## 2023-10-04 VITALS — BP 123/81 | HR 66 | Ht 65.0 in | Wt 175.0 lb

## 2023-10-04 DIAGNOSIS — Z87438 Personal history of other diseases of male genital organs: Secondary | ICD-10-CM

## 2023-10-04 DIAGNOSIS — N4889 Other specified disorders of penis: Secondary | ICD-10-CM | POA: Diagnosis not present

## 2023-10-04 DIAGNOSIS — F419 Anxiety disorder, unspecified: Secondary | ICD-10-CM | POA: Insufficient documentation

## 2023-10-04 DIAGNOSIS — R3129 Other microscopic hematuria: Secondary | ICD-10-CM | POA: Diagnosis not present

## 2023-10-04 DIAGNOSIS — M6289 Other specified disorders of muscle: Secondary | ICD-10-CM

## 2023-10-04 LAB — URINALYSIS, COMPLETE (UACMP) WITH MICROSCOPIC
Bilirubin Urine: NEGATIVE
Glucose, UA: NEGATIVE mg/dL
Hgb urine dipstick: NEGATIVE
Ketones, ur: NEGATIVE mg/dL
Leukocytes,Ua: NEGATIVE
Nitrite: NEGATIVE
Protein, ur: NEGATIVE mg/dL
Specific Gravity, Urine: >1.03 — ABNORMAL HIGH (ref 1.005–1.030)
pH: 5.5 (ref 5.0–8.0)

## 2023-10-05 ENCOUNTER — Other Ambulatory Visit: Payer: Self-pay | Admitting: Urology

## 2023-10-05 DIAGNOSIS — R3129 Other microscopic hematuria: Secondary | ICD-10-CM

## 2023-10-05 LAB — URINE CULTURE: Culture: NO GROWTH

## 2023-10-05 NOTE — Progress Notes (Signed)
 I have sent him a MyChart message regarding his negative urine culture results and that we need to pursue a CT renal stone study for further evaluation for his symptoms and the microscopic hematuria.  Would you please call him and get him scheduled for a return appointment to go over those results?  I have placed orders for the CT renal stone study.

## 2023-10-05 NOTE — Progress Notes (Signed)
 Pt aware. Appt made

## 2023-10-12 ENCOUNTER — Ambulatory Visit
Admission: RE | Admit: 2023-10-12 | Discharge: 2023-10-12 | Disposition: A | Discharge status: Home / Self Care | Source: Ambulatory Visit | Attending: Urology | Admitting: Urology

## 2023-10-12 DIAGNOSIS — R102 Pelvic and perineal pain: Secondary | ICD-10-CM | POA: Diagnosis not present

## 2023-10-12 DIAGNOSIS — K760 Fatty (change of) liver, not elsewhere classified: Secondary | ICD-10-CM | POA: Diagnosis not present

## 2023-10-12 DIAGNOSIS — R3129 Other microscopic hematuria: Secondary | ICD-10-CM | POA: Insufficient documentation

## 2023-10-12 DIAGNOSIS — R319 Hematuria, unspecified: Secondary | ICD-10-CM | POA: Diagnosis not present

## 2023-10-19 LAB — MISC LABCORP TEST (SEND OUT): Labcorp test code: 86884

## 2023-11-22 ENCOUNTER — Ambulatory Visit: Admitting: Urology

## 2023-11-24 ENCOUNTER — Other Ambulatory Visit: Admitting: Urology

## 2023-11-24 ENCOUNTER — Encounter: Payer: Self-pay | Admitting: Urology

## 2023-11-24 ENCOUNTER — Ambulatory Visit: Admitting: Urology

## 2023-11-29 ENCOUNTER — Ambulatory Visit: Admitting: Urology

## 2023-11-29 ENCOUNTER — Other Ambulatory Visit: Payer: Self-pay

## 2023-11-29 DIAGNOSIS — R3129 Other microscopic hematuria: Secondary | ICD-10-CM

## 2023-11-30 ENCOUNTER — Other Ambulatory Visit: Payer: Self-pay

## 2023-11-30 DIAGNOSIS — R3129 Other microscopic hematuria: Secondary | ICD-10-CM

## 2023-12-01 ENCOUNTER — Other Ambulatory Visit: Admitting: Urology

## 2023-12-01 ENCOUNTER — Other Ambulatory Visit
Admission: RE | Admit: 2023-12-01 | Discharge: 2023-12-01 | Disposition: A | Discharge status: Home / Self Care | Attending: Urology | Admitting: Urology

## 2023-12-01 ENCOUNTER — Ambulatory Visit: Admitting: Urology

## 2023-12-01 VITALS — BP 147/90 | HR 101

## 2023-12-01 DIAGNOSIS — R3129 Other microscopic hematuria: Secondary | ICD-10-CM | POA: Insufficient documentation

## 2023-12-01 DIAGNOSIS — R102 Pelvic and perineal pain: Secondary | ICD-10-CM | POA: Diagnosis not present

## 2023-12-01 LAB — URINALYSIS, COMPLETE (UACMP) WITH MICROSCOPIC
Bilirubin Urine: NEGATIVE
Glucose, UA: NEGATIVE mg/dL
Hgb urine dipstick: NEGATIVE
Ketones, ur: NEGATIVE mg/dL
Leukocytes,Ua: NEGATIVE
Nitrite: NEGATIVE
Protein, ur: NEGATIVE mg/dL
Specific Gravity, Urine: 1.02 (ref 1.005–1.030)
pH: 7.5 (ref 5.0–8.0)

## 2023-12-01 NOTE — Progress Notes (Signed)
   12/01/2023 1:24 PM   Jack Spencer 1985/12/10 [id number]  Reason for visit: Follow up pelvic floor dysfunction, health-related anxiety, microscopic hematuria  HPI: 38 year old male with severe health-related anxiety and stress who I previously followed for pelvic floor dysfunction.  He has had extensive negative workup including a normal scrotal ultrasound in March 2022, normal CT abdomen and pelvis November 2023, and a normal abdominal ultrasound in November 2023.  His symptoms have previously improved with pelvic floor physical therapy.  He saw Matilde Son, PA in March 2025 with some brief penile pain and flank pain that resolved spontaneously, urinalysis at that visit showed 11-20 RBC.  She ordered a CT stone protocol that was performed 1 week later.  I personally viewed and interpreted those images dated 10/12/2023 showing no abnormalities.  She set him up for cystoscopy.  I did not feel he warranted cystoscopy based on his long history of negative workup, and absence of symptoms, as well as the AUA guidelines that recommend considering a repeat urinalysis within 6 months prior to pursuing cystoscopy or imaging for patients under 40 who are low risk.  His symptoms have resolved and he has no complaints today.  Urinalysis today completely benign.  Reassurance provided, we reviewed the AUA guidelines regarding microscopic hematuria, again as he had flank pain and penile pain he really does not fit into the asymptomatic microscopic hematuria category anyway.  I do not feel he warrants cystoscopy, he is in agreement.  Follow-up with urology as needed  Lawerence Pressman, MD  [facility] [address] [place], [place] [address] [phone]

## 2023-12-01 NOTE — Patient Instructions (Signed)
 Jack Spencer

## 2024-05-16 DIAGNOSIS — R599 Enlarged lymph nodes, unspecified: Secondary | ICD-10-CM | POA: Diagnosis not present

## 2024-05-16 DIAGNOSIS — Z1159 Encounter for screening for other viral diseases: Secondary | ICD-10-CM | POA: Diagnosis not present

## 2024-05-16 DIAGNOSIS — R7401 Elevation of levels of liver transaminase levels: Secondary | ICD-10-CM | POA: Diagnosis not present
